# Patient Record
Sex: Female | Born: 1970 | Race: Black or African American | Hispanic: No | Smoking: Current some day smoker
Health system: Southern US, Community
[De-identification: ages and names within clinical notes are randomized; demographics above are authoritative.]

## PROBLEM LIST (undated history)

## (undated) DIAGNOSIS — F1910 Other psychoactive substance abuse, uncomplicated: Secondary | ICD-10-CM

## (undated) DIAGNOSIS — F1010 Alcohol abuse, uncomplicated: Secondary | ICD-10-CM

## (undated) DIAGNOSIS — F329 Major depressive disorder, single episode, unspecified: Secondary | ICD-10-CM

## (undated) DIAGNOSIS — E559 Vitamin D deficiency, unspecified: Secondary | ICD-10-CM

## (undated) DIAGNOSIS — K219 Gastro-esophageal reflux disease without esophagitis: Secondary | ICD-10-CM

## (undated) HISTORY — DX: Vitamin D deficiency, unspecified: E55.9

## (undated) HISTORY — PX: CHOLECYSTECTOMY: SHX55

## (undated) HISTORY — DX: Major depressive disorder, single episode, unspecified: F32.9

## (undated) HISTORY — DX: Other psychoactive substance abuse, uncomplicated: F19.10

## (undated) HISTORY — DX: Alcohol abuse, uncomplicated: F10.10

## (undated) HISTORY — PX: MANDIBLE RECONSTRUCTION: SHX431

## (undated) HISTORY — PX: MULTIPLE TOOTH EXTRACTIONS: SHX2053

---

## 1998-11-28 ENCOUNTER — Emergency Department (HOSPITAL_COMMUNITY): Admission: EM | Admit: 1998-11-28 | Discharge: 1998-11-29 | Payer: Self-pay | Admitting: Endocrinology

## 1998-11-29 ENCOUNTER — Encounter: Payer: Self-pay | Admitting: Emergency Medicine

## 1999-06-20 ENCOUNTER — Other Ambulatory Visit: Admission: RE | Admit: 1999-06-20 | Discharge: 1999-06-20 | Payer: Self-pay | Admitting: Obstetrics

## 1999-11-21 ENCOUNTER — Inpatient Hospital Stay (HOSPITAL_COMMUNITY): Admission: AD | Admit: 1999-11-21 | Discharge: 1999-11-21 | Payer: Self-pay | Admitting: Obstetrics

## 1999-11-28 ENCOUNTER — Inpatient Hospital Stay (HOSPITAL_COMMUNITY): Admission: AD | Admit: 1999-11-28 | Discharge: 1999-12-02 | Payer: Self-pay | Admitting: Obstetrics

## 1999-11-28 ENCOUNTER — Encounter (INDEPENDENT_AMBULATORY_CARE_PROVIDER_SITE_OTHER): Payer: Self-pay

## 2000-03-30 ENCOUNTER — Emergency Department (HOSPITAL_COMMUNITY): Admission: EM | Admit: 2000-03-30 | Discharge: 2000-03-30 | Payer: Self-pay | Admitting: Emergency Medicine

## 2005-06-04 ENCOUNTER — Emergency Department (HOSPITAL_COMMUNITY): Admission: EM | Admit: 2005-06-04 | Discharge: 2005-06-04 | Payer: Self-pay | Admitting: Emergency Medicine

## 2007-03-09 ENCOUNTER — Emergency Department (HOSPITAL_COMMUNITY): Admission: EM | Admit: 2007-03-09 | Discharge: 2007-03-09 | Payer: Self-pay | Admitting: Emergency Medicine

## 2007-03-31 ENCOUNTER — Emergency Department (HOSPITAL_COMMUNITY): Admission: EM | Admit: 2007-03-31 | Discharge: 2007-03-31 | Payer: Self-pay | Admitting: Emergency Medicine

## 2009-04-16 ENCOUNTER — Emergency Department (HOSPITAL_COMMUNITY): Admission: EM | Admit: 2009-04-16 | Discharge: 2009-04-16 | Payer: Self-pay | Admitting: Emergency Medicine

## 2009-07-03 ENCOUNTER — Emergency Department (HOSPITAL_COMMUNITY): Admission: EM | Admit: 2009-07-03 | Discharge: 2009-07-03 | Payer: Self-pay | Admitting: Emergency Medicine

## 2010-11-18 LAB — CBC
HCT: 36 % (ref 36.0–46.0)
Hemoglobin: 12.2 g/dL (ref 12.0–15.0)
MCHC: 34 g/dL (ref 30.0–36.0)
MCV: 91.8 fL (ref 78.0–100.0)
Platelets: 316 10*3/uL (ref 150–400)
RBC: 3.92 MIL/uL (ref 3.87–5.11)
RDW: 12.8 % (ref 11.5–15.5)
WBC: 8 10*3/uL (ref 4.0–10.5)

## 2010-11-18 LAB — DIFFERENTIAL
Basophils Absolute: 0.1 10*3/uL (ref 0.0–0.1)
Basophils Relative: 1 % (ref 0–1)
Eosinophils Absolute: 0.1 10*3/uL (ref 0.0–0.7)
Eosinophils Relative: 1 % (ref 0–5)
Lymphocytes Relative: 42 % (ref 12–46)
Lymphs Abs: 3.4 10*3/uL (ref 0.7–4.0)
Monocytes Absolute: 0.7 10*3/uL (ref 0.1–1.0)
Monocytes Relative: 8 % (ref 3–12)
Neutro Abs: 3.8 10*3/uL (ref 1.7–7.7)
Neutrophils Relative %: 48 % (ref 43–77)

## 2010-12-30 NOTE — Discharge Summary (Signed)
[facility]  Patient:    Kaylee White, Kaylee White                      MRN: [id number] Adm. Date:  [id number] Disc. Date: [id number] Attending:  Venita Sheffield                           Discharge Summary  HISTORY OF PRESENT ILLNESS:   The patient is a 40 year old, gravida 2, para 1-0-0-1, who had a previous cesarean section.  Her EDC was November 21, 1999, and she was brought in for induction.  Her cervix was closed, vertex -3.  HOSPITAL COURSE:              Cervidil was placed on admission and Pitocin began. The Pitocin was discontinued the first night on April 16.  The patient rested and was restarted on Pitocin at 4 a.m. on April 17.  The patient ruptured her membranes spontaneously at 1:30 p.m.  The fluid was clear.  Cervix was 1 to 2 cm, 80%, and vertex -2.  IUPC and scalp lead applied at that time and she was continued on her Pitocin.  She also had her epidural placed.  By 7:15 p.m. the patient started having prolonged deep variables and amnioinfusion had been begun earlier because of variable decellerations.  However, the problems was not resolved and she was delivered by repeat cesarean section.  She had a female, Apgars 5 and 9, weighing 6 pounds 11 ounces.  She also had a tubal ligation performed.  Postoperatively, she did well.  On admission her hemoglobin was 11.6 and on discharge 10.6.  She was  discharged on the third postoperative day, ambulatory, on a regular diet.  DISCHARGE MEDICATIONS:        Tylenol No.3 one q.4h. p.r.n.  FOLLOW-UP:                    She is to see me in six weeks.  DISCHARGE DIAGNOSIS:          Status post repeat low transverse cesarean section for failed induction and postpartum tubal ligation, multiparity. DD:  12/22/99 TD:  12/22/99 Job: [id number]

## 2017-05-28 ENCOUNTER — Emergency Department (HOSPITAL_COMMUNITY)
Admission: EM | Admit: 2017-05-28 | Discharge: 2017-05-28 | Disposition: A | Discharge status: Home / Self Care | Payer: Medicaid Other | Attending: Emergency Medicine | Admitting: Emergency Medicine

## 2017-05-28 ENCOUNTER — Encounter (HOSPITAL_COMMUNITY): Payer: Self-pay | Admitting: Emergency Medicine

## 2017-05-28 DIAGNOSIS — Z79899 Other long term (current) drug therapy: Secondary | ICD-10-CM | POA: Insufficient documentation

## 2017-05-28 DIAGNOSIS — Z87891 Personal history of nicotine dependence: Secondary | ICD-10-CM | POA: Insufficient documentation

## 2017-05-28 DIAGNOSIS — K0889 Other specified disorders of teeth and supporting structures: Secondary | ICD-10-CM | POA: Diagnosis present

## 2017-05-28 DIAGNOSIS — K047 Periapical abscess without sinus: Secondary | ICD-10-CM | POA: Insufficient documentation

## 2017-05-28 DIAGNOSIS — N390 Urinary tract infection, site not specified: Secondary | ICD-10-CM | POA: Insufficient documentation

## 2017-05-28 LAB — CBC WITH DIFFERENTIAL/PLATELET
Basophils Absolute: 0 10*3/uL (ref 0.0–0.1)
Basophils Relative: 0 %
Eosinophils Absolute: 0 10*3/uL (ref 0.0–0.7)
Eosinophils Relative: 0 %
HEMATOCRIT: 42.8 % (ref 36.0–46.0)
HEMOGLOBIN: 14.4 g/dL (ref 12.0–15.0)
LYMPHS ABS: 1.6 10*3/uL (ref 0.7–4.0)
Lymphocytes Relative: 13 %
MCH: 31.6 pg (ref 26.0–34.0)
MCHC: 33.6 g/dL (ref 30.0–36.0)
MCV: 94.1 fL (ref 78.0–100.0)
MONOS PCT: 7 %
Monocytes Absolute: 0.8 10*3/uL (ref 0.1–1.0)
Neutro Abs: 9.8 10*3/uL — ABNORMAL HIGH (ref 1.7–7.7)
Neutrophils Relative %: 80 %
Platelets: 385 10*3/uL (ref 150–400)
RBC: 4.55 MIL/uL (ref 3.87–5.11)
RDW: 13.2 % (ref 11.5–15.5)
WBC: 12.3 10*3/uL — AB (ref 4.0–10.5)

## 2017-05-28 LAB — URINALYSIS, ROUTINE W REFLEX MICROSCOPIC
Bilirubin Urine: NEGATIVE
Glucose, UA: NEGATIVE mg/dL
KETONES UR: NEGATIVE mg/dL
Nitrite: NEGATIVE
PROTEIN: 100 mg/dL — AB
Specific Gravity, Urine: 1.011 (ref 1.005–1.030)
pH: 5 (ref 5.0–8.0)

## 2017-05-28 LAB — COMPREHENSIVE METABOLIC PANEL
ALT: 29 U/L (ref 14–54)
AST: 19 U/L (ref 15–41)
Albumin: 4.8 g/dL (ref 3.5–5.0)
Alkaline Phosphatase: 107 U/L (ref 38–126)
Anion gap: 12 (ref 5–15)
BILIRUBIN TOTAL: 0.4 mg/dL (ref 0.3–1.2)
BUN: 10 mg/dL (ref 6–20)
CO2: 17 mmol/L — ABNORMAL LOW (ref 22–32)
Calcium: 10.1 mg/dL (ref 8.9–10.3)
Chloride: 109 mmol/L (ref 101–111)
Creatinine, Ser: 1.36 mg/dL — ABNORMAL HIGH (ref 0.44–1.00)
GFR calc Af Amer: 54 mL/min — ABNORMAL LOW (ref >60)
GFR, EST NON AFRICAN AMERICAN: 46 mL/min — AB (ref >60)
Glucose, Bld: 125 mg/dL — ABNORMAL HIGH (ref 65–99)
Potassium: 4 mmol/L (ref 3.5–5.1)
Sodium: 138 mmol/L (ref 135–145)
TOTAL PROTEIN: 9.6 g/dL — AB (ref 6.5–8.1)

## 2017-05-28 LAB — I-STAT CG4 LACTIC ACID, ED: Lactic Acid, Venous: 0.88 mmol/L (ref 0.5–1.9)

## 2017-05-28 MED ORDER — ONDANSETRON 4 MG PO TBDP
4.0000 mg | ORAL_TABLET | Freq: Once | ORAL | Status: AC | PRN
  Administered 2017-05-28: 4 mg via ORAL
  Filled 2017-05-28 (×2): qty 1

## 2017-05-28 MED ORDER — CEFTRIAXONE SODIUM 1 G IJ SOLR
1.0000 g | Freq: Once | INTRAMUSCULAR | Status: AC
  Administered 2017-05-28: 1 g via INTRAMUSCULAR
  Filled 2017-05-28: qty 10

## 2017-05-28 MED ORDER — PENICILLIN V POTASSIUM 500 MG PO TABS: 500.0000 mg | ORAL_TABLET | Freq: Four times a day (QID) | ORAL | 0 refills | Status: AC

## 2017-05-28 MED ORDER — HYDROMORPHONE HCL 1 MG/ML IJ SOLN
1.0000 mg | Freq: Once | INTRAMUSCULAR | Status: AC
  Administered 2017-05-28: 1 mg via INTRAMUSCULAR
  Filled 2017-05-28: qty 1

## 2017-05-28 MED ORDER — OXYCODONE-ACETAMINOPHEN 5-325 MG PO TABS: 1.0000 | ORAL_TABLET | Freq: Four times a day (QID) | ORAL | 0 refills | Status: DC | PRN

## 2017-05-28 MED ORDER — ONDANSETRON 4 MG PO TBDP: ORAL_TABLET | ORAL | 0 refills | Status: DC

## 2017-05-28 MED ORDER — LIDOCAINE HCL (PF) 1 % IJ SOLN
INTRAMUSCULAR | Status: AC
  Administered 2017-05-28: 2.1 mL
  Filled 2017-05-28: qty 5

## 2017-05-28 NOTE — ED Notes (Signed)
Bed: WA04 Expected date:  Expected time:  Means of arrival:  Comments: 

## 2017-05-28 NOTE — Discharge Instructions (Signed)
Follow-up with the dentist this week. If he can't see a dentist and see her family doctor this week

## 2017-05-28 NOTE — ED Notes (Signed)
Bed: WLPT1 Expected date:  Expected time:  Means of arrival:  Comments: 

## 2017-05-28 NOTE — ED Triage Notes (Signed)
Pt complaint of left lower dental pain with associated facial swelling; has appointment with dentist "but it is not for a while."

## 2017-05-28 NOTE — ED Notes (Signed)
No additional lactic drawn as the first one was WNL

## 2017-05-28 NOTE — ED Provider Notes (Signed)
[facility]-EMERGENCY DEPT Provider Note   CSN: [id number] Arrival date & time: 05/28/17  1332     History   Chief Complaint Chief Complaint  Patient presents with  . Dental Pain  . Facial Swelling    HPI Kaylee White is a 46 y.o. female.  Patient complains of pain  And swelling to the left side her face.   The history is provided by the patient.  Dental Pain   This is a new problem. The current episode started 1 to 2 hours ago. The problem occurs constantly. The problem has not changed since onset.The pain is at a severity of 6/10. The pain is moderate. She has tried nothing for the symptoms. The treatment provided no relief.    History reviewed. No pertinent past medical history.  There are no active problems to display for this patient.   History reviewed. No pertinent surgical history.  OB History    No data available       Home Medications    Prior to Admission medications   Medication Sig Start Date End Date Taking? Authorizing Provider  ibuprofen (ADVIL,MOTRIN) 200 MG tablet Take 600 mg by mouth every 4 (four) hours as needed for moderate pain.    Yes [provider]  oxyCODONE-acetaminophen (PERCOCET) 5-325 MG tablet Take 1 tablet by mouth every 6 (six) hours as needed. 05/28/17   Bethann Berkshire, MD  penicillin v potassium (VEETID) 500 MG tablet Take 1 tablet (500 mg total) by mouth 4 (four) times daily. 05/28/17 06/04/17  Bethann Berkshire, MD    Family History No family history on file.  Social History Social History  Substance Use Topics  . Smoking status: Former Smoker    Packs/day: 0.25    Types: Cigarettes  . Smokeless tobacco: Not on file  . Alcohol use No     Allergies   Patient has no known allergies.   Review of Systems Review of Systems  Constitutional: Negative for appetite change and fatigue.  HENT: Negative for congestion, ear discharge and sinus pressure.        Toothache  Eyes: Negative for  discharge.  Respiratory: Negative for cough.   Cardiovascular: Negative for chest pain.  Gastrointestinal: Negative for abdominal pain and diarrhea.  Genitourinary: Negative for frequency and hematuria.  Musculoskeletal: Negative for back pain.  Skin: Negative for rash.  Neurological: Negative for seizures and headaches.  Psychiatric/Behavioral: Negative for hallucinations.     Physical Exam Updated Vital Signs BP (!) 143/96   Pulse 71   Temp 97.7 F (36.5 C) (Oral)   Resp 18   SpO2 100%   Physical Exam  Constitutional: She is oriented to person, place, and time. She appears well-developed.  HENT:  Head: Normocephalic.  Swelling tenderness to left lower jaw  Eyes: Conjunctivae and EOM are normal. No scleral icterus.  Neck: Neck supple. No thyromegaly present.  Cardiovascular: Normal rate and regular rhythm.  Exam reveals no gallop and no friction rub.   No murmur heard. Pulmonary/Chest: No stridor. She has no wheezes. She has no rales. She exhibits no tenderness.  Abdominal: She exhibits no distension. There is no tenderness. There is no rebound.  Musculoskeletal: Normal range of motion. She exhibits no edema.  Lymphadenopathy:    She has no cervical adenopathy.  Neurological: She is oriented to person, place, and time. She exhibits normal muscle tone. Coordination normal.  Skin: No rash noted. No erythema.  Psychiatric: She has a normal mood and affect. Her  behavior is normal.     ED Treatments / Results  Labs (all labs ordered are listed, but only abnormal results are displayed) Labs Reviewed  COMPREHENSIVE METABOLIC PANEL - Abnormal; Notable for the following:       Result Value   CO2 17 (*)    Glucose, Bld 125 (*)    Creatinine, Ser 1.36 (*)    Total Protein 9.6 (*)    GFR calc non Af Amer 46 (*)    GFR calc Af Amer 54 (*)    All other components within normal limits  CBC WITH DIFFERENTIAL/PLATELET - Abnormal; Notable for the following:    WBC 12.3 (*)     Neutro Abs 9.8 (*)    All other components within normal limits  URINALYSIS, ROUTINE W REFLEX MICROSCOPIC - Abnormal; Notable for the following:    APPearance CLOUDY (*)    Hgb urine dipstick MODERATE (*)    Protein, ur 100 (*)    Leukocytes, UA LARGE (*)    Bacteria, UA FEW (*)    Squamous Epithelial / LPF 6-30 (*)    All other components within normal limits  URINE CULTURE  I-STAT CG4 LACTIC ACID, ED  I-STAT CG4 LACTIC ACID, ED    EKG  EKG Interpretation None       Radiology No results found.  Procedures Procedures (including critical care time)  Medications Ordered in ED Medications  ondansetron (ZOFRAN-ODT) disintegrating tablet 4 mg (not administered)  HYDROmorphone (DILAUDID) injection 1 mg (1 mg Intramuscular Given 05/28/17 2210)  cefTRIAXone (ROCEPHIN) injection 1 g (1 g Intramuscular Given 05/28/17 2214)  lidocaine (PF) (XYLOCAINE) 1 % injection (2.1 mLs  Given 05/28/17 2214)     Initial Impression / Assessment and Plan / ED Course  I have reviewed the triage vital signs and the nursing notes.  Pertinent labs & imaging results that were available during my care of the patient were reviewed by me and considered in my medical decision making (see chart for details).    patient with an abscessed tooth and a urinary tract infection. She will have urine cultured and will be started on penicillin for the tooth along with Percocets and she'll follow-up with the dentist. We will await the cultures of the urine see if she needs additional antibiotic besides penicillin to follow-up with her PCP   Final Clinical Impressions(s) / ED Diagnoses   Final diagnoses:  Dental abscess    New Prescriptions New Prescriptions   OXYCODONE-ACETAMINOPHEN (PERCOCET) 5-325 MG TABLET    Take 1 tablet by mouth every 6 (six) hours as needed.   PENICILLIN V POTASSIUM (VEETID) 500 MG TABLET    Take 1 tablet (500 mg total) by mouth 4 (four) times daily.     Bethann Berkshire,  MD 05/28/17 2244

## 2017-05-31 LAB — URINE CULTURE

## 2017-06-01 ENCOUNTER — Telehealth: Payer: Self-pay

## 2017-06-01 NOTE — Telephone Encounter (Signed)
No treatment for [facility] ED 05/28/17 Per Donivan Scullobyn Wojeck NP

## 2017-06-01 NOTE — Progress Notes (Signed)
ED Antimicrobial Stewardship Positive Culture Follow Up   Kaylee GreenhouseWyoshi L Fredric White is an 46 y.o. female [facility] with a chief complaint of  Chief Complaint  Patient presents with  . Dental Pain  . Facial Swelling    Recent Results (from the past 720 hour(s))  Urine culture     Status: Abnormal   Collection Time: 05/28/17  9:30 PM  Result Value Ref Range Status   Specimen Description URINE, CLEAN CATCH  Final   Special Requests NONE  Final   Culture >=100,000 COLONIES/mL ESCHERICHIA COLI (A)  Final   Report Status 05/31/2017 FINAL  Final   Organism ID, Bacteria ESCHERICHIA COLI (A)  Final      Susceptibility   Escherichia coli - MIC*    AMPICILLIN 4 SENSITIVE Sensitive     CEFAZOLIN <=4 SENSITIVE Sensitive     CEFTRIAXONE <=1 SENSITIVE Sensitive     CIPROFLOXACIN <=0.25 SENSITIVE Sensitive     GENTAMICIN <=1 SENSITIVE Sensitive     IMIPENEM <=0.25 SENSITIVE Sensitive     NITROFURANTOIN <=16 SENSITIVE Sensitive     TRIMETH/SULFA <=20 SENSITIVE Sensitive     AMPICILLIN/SULBACTAM <=2 SENSITIVE Sensitive     PIP/TAZO <=4 SENSITIVE Sensitive     Extended ESBL NEGATIVE Sensitive     * >=100,000 COLONIES/mL ESCHERICHIA COLI    Discharged with Pen V for dental abscess  Asmymptomatic bacteriuria. No need to treat  ED Provider: Donivan Scullobyn Wojeck, NP  Armandina StammerBATCHELDER,Almetta Liddicoat J 06/01/2017, 9:34 AM Infectious Diseases Pharmacist Phone# [phone]

## 2017-10-20 ENCOUNTER — Other Ambulatory Visit: Payer: Self-pay

## 2017-10-20 ENCOUNTER — Emergency Department (HOSPITAL_COMMUNITY): Payer: Medicaid Other

## 2017-10-20 ENCOUNTER — Emergency Department (HOSPITAL_COMMUNITY)
Admission: EM | Admit: 2017-10-20 | Discharge: 2017-10-20 | Disposition: A | Discharge status: Home / Self Care | Payer: Medicaid Other | Attending: Emergency Medicine | Admitting: Emergency Medicine

## 2017-10-20 DIAGNOSIS — Z87891 Personal history of nicotine dependence: Secondary | ICD-10-CM | POA: Insufficient documentation

## 2017-10-20 DIAGNOSIS — R141 Gas pain: Secondary | ICD-10-CM | POA: Insufficient documentation

## 2017-10-20 DIAGNOSIS — R079 Chest pain, unspecified: Secondary | ICD-10-CM | POA: Diagnosis present

## 2017-10-20 DIAGNOSIS — R1013 Epigastric pain: Secondary | ICD-10-CM | POA: Insufficient documentation

## 2017-10-20 LAB — ETHANOL: Alcohol, Ethyl (B): 15 mg/dL — ABNORMAL HIGH (ref <10)

## 2017-10-20 LAB — CBC WITH DIFFERENTIAL/PLATELET
Basophils Absolute: 0 10*3/uL (ref 0.0–0.1)
Basophils Relative: 1 %
EOS ABS: 0.1 10*3/uL (ref 0.0–0.7)
Eosinophils Relative: 1 %
HCT: 36.5 % (ref 36.0–46.0)
HEMOGLOBIN: 12.1 g/dL (ref 12.0–15.0)
LYMPHS ABS: 1.7 10*3/uL (ref 0.7–4.0)
Lymphocytes Relative: 22 %
MCH: 30.7 pg (ref 26.0–34.0)
MCHC: 33.2 g/dL (ref 30.0–36.0)
MCV: 92.6 fL (ref 78.0–100.0)
MONOS PCT: 9 %
Monocytes Absolute: 0.7 10*3/uL (ref 0.1–1.0)
NEUTROS ABS: 5.2 10*3/uL (ref 1.7–7.7)
NEUTROS PCT: 67 %
Platelets: 329 10*3/uL (ref 150–400)
RBC: 3.94 MIL/uL (ref 3.87–5.11)
RDW: 13.3 % (ref 11.5–15.5)
WBC: 7.7 10*3/uL (ref 4.0–10.5)

## 2017-10-20 LAB — COMPREHENSIVE METABOLIC PANEL
ALBUMIN: 4.2 g/dL (ref 3.5–5.0)
ALK PHOS: 124 U/L (ref 38–126)
ALT: 39 U/L (ref 14–54)
AST: 23 U/L (ref 15–41)
Anion gap: 13 (ref 5–15)
BUN: 11 mg/dL (ref 6–20)
CALCIUM: 9.3 mg/dL (ref 8.9–10.3)
CO2: 22 mmol/L (ref 22–32)
CREATININE: 0.76 mg/dL (ref 0.44–1.00)
Chloride: 105 mmol/L (ref 101–111)
GFR calc non Af Amer: >60 mL/min (ref >60)
GLUCOSE: 90 mg/dL (ref 65–99)
Potassium: 3.9 mmol/L (ref 3.5–5.1)
SODIUM: 140 mmol/L (ref 135–145)
TOTAL PROTEIN: 8.5 g/dL — AB (ref 6.5–8.1)
Total Bilirubin: 0.5 mg/dL (ref 0.3–1.2)

## 2017-10-20 LAB — I-STAT TROPONIN, ED: Troponin i, poc: 0 ng/mL (ref 0.00–0.08)

## 2017-10-20 LAB — LIPASE, BLOOD: Lipase: 33 U/L (ref 11–51)

## 2017-10-20 MED ORDER — SIMETHICONE 80 MG PO CHEW
160.0000 mg | CHEWABLE_TABLET | Freq: Once | ORAL | Status: AC
  Administered 2017-10-20: 160 mg via ORAL
  Filled 2017-10-20: qty 2

## 2017-10-20 NOTE — ED Provider Notes (Signed)
[facility] EMERGENCY DEPARTMENT Provider Note   CSN: [id number] Arrival date & time: 10/20/17  0117     History   Chief Complaint Chief Complaint  Patient presents with  . Chest Pain    HPI Kaylee White is a 47 y.o. female.  The history is provided by the patient and medical records.     47 year old female with history of alcohol abuse, presenting to the ED with epigastric/chest pain.  Reports this is been ongoing for about 2 weeks.  States it feels like a "bubble" in her lower midsternal region.  States pain is sharp and when she moves around she feels like the bubble is "floating around" inside. Has had some burning sensations as well. States it feels better when sitting upright, worse with lying down.  She denies any chest "pain" or shortness of breath.  No palpitations, dizziness, or weakness.  No diaphoresis, nausea, or vomiting.  Patient has no known cardiac history.  She did recently complete detox program for alcohol abuse, however has since started drinking again "states only a "little bit".  States she did take some Zantac prior to arrival which helped some with the burning but still feels the bubble sensation.  No past medical history on file.  There are no active problems to display for this patient.   No past surgical history on file.  OB History    No data available       Home Medications    Prior to Admission medications   Medication Sig Start Date End Date Taking? Authorizing Provider  ibuprofen (ADVIL,MOTRIN) 200 MG tablet Take 600 mg by mouth every 4 (four) hours as needed for moderate pain.     [provider]  ondansetron (ZOFRAN ODT) 4 MG disintegrating tablet 4mg  ODT q4 hours prn nausea/vomit 05/28/17   Bethann Berkshire, MD  oxyCODONE-acetaminophen (PERCOCET) 5-325 MG tablet Take 1 tablet by mouth every 6 (six) hours as needed. 05/28/17   Bethann Berkshire, MD    Family History No family history on file.  Social  History Social History   Tobacco Use  . Smoking status: Former Smoker    Packs/day: 0.25    Types: Cigarettes  Substance Use Topics  . Alcohol use: No  . Drug use: No     Allergies   Aspirin and Tylenol [acetaminophen]   Review of Systems Review of Systems  Cardiovascular: Positive for chest pain.  Gastrointestinal: Positive for abdominal pain.  All other systems reviewed and are negative.    Physical Exam Updated Vital Signs BP (!) 169/108 (BP Location: Right Arm)   Pulse 73   Temp 98.8 F (37.1 C) (Oral)   Ht 5' (1.524 m)   Wt 64.4 kg (142 lb)   SpO2 97%   BMI 27.73 kg/m   Physical Exam  Constitutional: She is oriented to person, place, and time. She appears well-developed and well-nourished.  HENT:  Head: Normocephalic and atraumatic.  Mouth/Throat: Oropharynx is clear and moist.  Eyes: Conjunctivae and EOM are normal. Pupils are equal, round, and reactive to light.  Neck: Normal range of motion.  Cardiovascular: Normal rate, regular rhythm and normal heart sounds.  Pulmonary/Chest: Effort normal and breath sounds normal. She has no wheezes. She has no rhonchi. She has no rales.  Abdominal: Soft. Bowel sounds are normal. There is no tenderness. There is no rigidity and no guarding.  Musculoskeletal: Normal range of motion.  Neurological: She is alert and oriented to person, place, and time.  Skin: Skin is warm and dry.  Psychiatric: She has a normal mood and affect.  Nursing note and vitals reviewed.    ED Treatments / Results  Labs (all labs ordered are listed, but only abnormal results are displayed) Labs Reviewed  COMPREHENSIVE METABOLIC PANEL - Abnormal; Notable for the following components:      Result Value   Total Protein 8.5 (*)    All other components within normal limits  ETHANOL - Abnormal; Notable for the following components:   Alcohol, Ethyl (B) 15 (*)    All other components within normal limits  CBC WITH DIFFERENTIAL/PLATELET   LIPASE, BLOOD  I-STAT TROPONIN, ED    EKG  EKG Interpretation  Date/Time:  Saturday October 20 2017 01:22:13 EST Ventricular Rate:  71 PR Interval:    QRS Duration: 78 QT Interval:  411 QTC Calculation: 447 R Axis:   68 Text Interpretation:  Sinus rhythm Normal ECG Confirmed by Gilda CreasePollina, Christopher J [phone]([id number]) on 10/20/2017 1:24:33 AM       Radiology Dg Chest 2 View  Result Date: 10/20/2017 CLINICAL DATA:  Acute onset of epigastric abdominal pain. EXAM: CHEST - 2 VIEW COMPARISON:  Chest radiograph performed 07/03/2009 FINDINGS: The lungs are well-aerated and clear. There is no evidence of focal opacification, pleural effusion or pneumothorax. The heart is normal in size; the mediastinal contour is within normal limits. No acute osseous abnormalities are seen. Clips are noted within the right upper quadrant, reflecting prior cholecystectomy. IMPRESSION: No acute cardiopulmonary process seen. Electronically Signed   By: Roanna RaiderJeffery  Chang M.D.   On: 10/20/2017 02:13    Procedures Procedures (including critical care time)  Medications Ordered in ED Medications - No data to display   Initial Impression / Assessment and Plan / ED Course  I have reviewed the triage vital signs and the nursing notes.  Pertinent labs & imaging results that were available during my care of the patient were reviewed by me and considered in my medical decision making (see chart for details).  47 year old female here with chest/epigastric pain over the past 2 weeks.  States it feels like a "gas bubble" in her lower chest.  Does report some burning sensation but that has improved after Zantac at home.  She is afebrile and nontoxic.  Abdomen is soft and benign.  Lungs are clear.  EKG nonischemic.  Screening labs overall reassuring.  Ethanol is 15, admits she has been drinking some alcohol recently.  Symptoms have resolved after Gas-X and soda here in the ED.  Abdomen remains soft and benign.  Suspect gas pains.  Lower  suspicion for ACS, PE, dissection, acute cardiac event.  Recommended she could continue gas-x at home.  Close follow-up with PCP.  Discussed plan with patient, she acknowledged understanding and agreed with plan of care.  Return precautions given for new or worsening symptoms.  Final Clinical Impressions(s) / ED Diagnoses   Final diagnoses:  Gas pain    ED Discharge Orders    None       Garlon HatchetSanders, Doris Mcgilvery M, PA-C 10/20/17 0519    Gilda CreasePollina, Christopher J, MD 10/20/17 [phone]

## 2017-10-20 NOTE — Discharge Instructions (Signed)
Can continue over the counter gas-x.  Keep carbonated beverages on hand, continue to get up and move around to help gas transit through. Follow-up with your primary care doctor. Return here for any new/worsening symptoms.

## 2017-10-20 NOTE — ED Notes (Signed)
Patient transported to X-ray 

## 2017-10-20 NOTE — ED Triage Notes (Signed)
Pt to ED via ems for epigastric pain x10 days. Hx of acid reflux, sts feels like bubble in chest. No hx of HTN, endorses stress. 180/112 with EMS. Denies N/V. NAD.

## 2017-11-09 ENCOUNTER — Encounter (HOSPITAL_COMMUNITY): Payer: Self-pay | Admitting: *Deleted

## 2017-11-09 ENCOUNTER — Emergency Department (HOSPITAL_COMMUNITY)
Admission: EM | Admit: 2017-11-09 | Discharge: 2017-11-09 | Disposition: A | Discharge status: Home / Self Care | Payer: Medicaid Other | Attending: Physician Assistant | Admitting: Physician Assistant

## 2017-11-09 ENCOUNTER — Other Ambulatory Visit: Payer: Self-pay

## 2017-11-09 DIAGNOSIS — Z79899 Other long term (current) drug therapy: Secondary | ICD-10-CM | POA: Insufficient documentation

## 2017-11-09 DIAGNOSIS — N3090 Cystitis, unspecified without hematuria: Secondary | ICD-10-CM | POA: Diagnosis not present

## 2017-11-09 DIAGNOSIS — R1030 Lower abdominal pain, unspecified: Secondary | ICD-10-CM | POA: Diagnosis present

## 2017-11-09 DIAGNOSIS — N3000 Acute cystitis without hematuria: Secondary | ICD-10-CM

## 2017-11-09 DIAGNOSIS — Z87891 Personal history of nicotine dependence: Secondary | ICD-10-CM | POA: Insufficient documentation

## 2017-11-09 LAB — URINALYSIS, ROUTINE W REFLEX MICROSCOPIC
Bilirubin Urine: NEGATIVE
GLUCOSE, UA: NEGATIVE mg/dL
Ketones, ur: NEGATIVE mg/dL
Nitrite: POSITIVE — AB
PH: 5 (ref 5.0–8.0)
Protein, ur: NEGATIVE mg/dL
Specific Gravity, Urine: 1.012 (ref 1.005–1.030)

## 2017-11-09 MED ORDER — CEPHALEXIN 500 MG PO CAPS: 500.0000 mg | ORAL_CAPSULE | Freq: Four times a day (QID) | ORAL | 0 refills | Status: DC

## 2017-11-09 MED ORDER — CEPHALEXIN 250 MG PO CAPS
500.0000 mg | ORAL_CAPSULE | Freq: Once | ORAL | Status: AC
  Administered 2017-11-09: 500 mg via ORAL
  Filled 2017-11-09: qty 2

## 2017-11-09 NOTE — ED Notes (Signed)
Pt verbalized understanding of d.c instructions, no further questions, pt a.o, ambulatory upon d.c 

## 2017-11-09 NOTE — Discharge Instructions (Addendum)
Please return with any concerns or if not improving.

## 2017-11-09 NOTE — ED Provider Notes (Signed)
[facility] Provider Note   CSN: [id number] Arrival date & time: 11/09/17  0901     History   Chief Complaint Chief Complaint  Patient presents with  . Urinary Tract Infection    HPI Demetrios IsaacsWyoshi L Wenk is a 47 y.o. female.  HPI   Patient is a [age] year old female presenting with suprapubic pain.  She states she feels like she might have a urinary tract infection.  Patient has no CVA pain or tenderness.  Patient has no nausea no vomiting.  No fevers.  Patient has no allergies to medications.  Patient has no vaginal discharge.   History reviewed. No pertinent past medical history.  There are no active problems to display for this patient.   History reviewed. No pertinent surgical history.   OB History   None      Home Medications    Prior to Admission medications   Medication Sig Start Date End Date Taking? Authorizing Provider  amantadine (SYMMETREL) 100 MG capsule Take 100 mg by mouth 3 (three) times daily. 09/25/17   [provider]  cephALEXin (KEFLEX) 500 MG capsule Take 1 capsule (500 mg total) by mouth 4 (four) times daily. 11/09/17   Rakeb Kibble Lyn, MD  FLUoxetine (PROZAC) 20 MG capsule Take 20 mg by mouth daily. 09/24/17   [provider]  ondansetron (ZOFRAN ODT) 4 MG disintegrating tablet 4mg  ODT q4 hours prn nausea/vomit Patient not taking: Reported on 10/20/2017 05/28/17   Bethann BerkshireZammit, Joseph, MD  oxyCODONE-acetaminophen (PERCOCET) 5-325 MG tablet Take 1 tablet by mouth every 6 (six) hours as needed. Patient not taking: Reported on 10/20/2017 05/28/17   Bethann BerkshireZammit, Joseph, MD  traZODone (DESYREL) 150 MG tablet Take 150 mg by mouth at bedtime. 09/24/17   [provider]    Family History History reviewed. No pertinent family history.  Social History Social History   Tobacco Use  . Smoking status: Former Smoker    Packs/day: 0.25    Types: Cigarettes  Substance Use Topics  . Alcohol use: No  .  Drug use: No     Allergies   Aspirin and Tylenol [acetaminophen]   Review of Systems Review of Systems  Constitutional: Negative for activity change.  Respiratory: Negative for chest tightness.   Cardiovascular: Negative for chest pain.  Skin: Negative for rash.  All other systems reviewed and are negative.    Physical Exam Updated Vital Signs BP (!) 156/88 (BP Location: Right Arm)   Pulse 80   Temp 97.8 F (36.6 C) (Oral)   Resp 20   SpO2 98%   Physical Exam  Constitutional: She is oriented to person, place, and time. She appears well-developed and well-nourished.  HENT:  Head: Normocephalic and atraumatic.  Eyes: Right eye exhibits no discharge. Left eye exhibits no discharge.  Cardiovascular: Normal rate, regular rhythm and normal heart sounds.  No murmur heard. Pulmonary/Chest: Effort normal and breath sounds normal. She has no wheezes. She has no rales.  Abdominal: Soft. She exhibits no distension. There is tenderness.  Suprapubic.  Neurological: She is oriented to person, place, and time.  Skin: Skin is warm and dry. She is not diaphoretic. Pallor:    Psychiatric: She has a normal mood and affect.  Nursing note and vitals reviewed.    ED Treatments / Results  Labs (all labs ordered are listed, but only abnormal results are displayed) Labs Reviewed  URINALYSIS, ROUTINE W REFLEX MICROSCOPIC - Abnormal; Notable for the following components:  Result Value   Hgb urine dipstick SMALL (*)    Nitrite POSITIVE (*)    Leukocytes, UA SMALL (*)    Bacteria, UA MANY (*)    Squamous Epithelial / LPF 0-5 (*)    All other components within normal limits  URINE CULTURE    EKG None  Radiology No results found.  Procedures Procedures (including critical care time)  Medications Ordered in ED Medications  cephALEXin (KEFLEX) capsule 500 mg (has no administration in time range)     Initial Impression / Assessment and Plan / ED Course  I have reviewed  the triage vital signs and the nursing notes.  Pertinent labs & imaging results that were available during my care of the patient were reviewed by me and considered in my medical decision making (see chart for details).      Patient is a 47 year old female presenting with suprapubic pain.  She states she feels like she might have a urinary tract infection.  Patient has no CVA pain or tenderness.  Patient has no nausea no vomiting.  No fevers.  Patient has no allergies to medications.  Patient has no vaginal discharge.  2:10 PM Urine appears infected.  Will give Keflex and return precautions. Final Clinical Impressions(s) / ED Diagnoses   Final diagnoses:  Acute cystitis without hematuria    ED Discharge Orders        Ordered    cephALEXin (KEFLEX) 500 MG capsule  4 times daily     11/09/17 1406       Kennis Buell Lyn, MD 11/09/17 1410

## 2017-11-09 NOTE — ED Notes (Signed)
C/o pain with urination and odor to urine onset 2 days ago.,

## 2017-11-09 NOTE — ED Triage Notes (Signed)
Pt reports having possible UTI. Reports dark colored urine with foul odor. Denies vaginal symptoms or pain.

## 2017-11-11 LAB — URINE CULTURE

## 2017-11-12 ENCOUNTER — Telehealth: Payer: Self-pay | Admitting: Emergency Medicine

## 2017-11-12 ENCOUNTER — Other Ambulatory Visit: Payer: Self-pay

## 2017-11-12 ENCOUNTER — Encounter (HOSPITAL_COMMUNITY): Payer: Self-pay

## 2017-11-12 ENCOUNTER — Emergency Department (HOSPITAL_COMMUNITY)
Admission: EM | Admit: 2017-11-12 | Discharge: 2017-11-12 | Disposition: A | Discharge status: Home / Self Care | Payer: Medicaid Other | Attending: Emergency Medicine | Admitting: Emergency Medicine

## 2017-11-12 ENCOUNTER — Emergency Department (HOSPITAL_COMMUNITY): Payer: Medicaid Other

## 2017-11-12 DIAGNOSIS — Y999 Unspecified external cause status: Secondary | ICD-10-CM | POA: Insufficient documentation

## 2017-11-12 DIAGNOSIS — M542 Cervicalgia: Secondary | ICD-10-CM | POA: Insufficient documentation

## 2017-11-12 DIAGNOSIS — Y92410 Unspecified street and highway as the place of occurrence of the external cause: Secondary | ICD-10-CM | POA: Insufficient documentation

## 2017-11-12 DIAGNOSIS — Y9389 Activity, other specified: Secondary | ICD-10-CM | POA: Diagnosis not present

## 2017-11-12 DIAGNOSIS — Z87891 Personal history of nicotine dependence: Secondary | ICD-10-CM | POA: Insufficient documentation

## 2017-11-12 DIAGNOSIS — R0789 Other chest pain: Secondary | ICD-10-CM | POA: Diagnosis not present

## 2017-11-12 DIAGNOSIS — M549 Dorsalgia, unspecified: Secondary | ICD-10-CM | POA: Insufficient documentation

## 2017-11-12 DIAGNOSIS — M7918 Myalgia, other site: Secondary | ICD-10-CM

## 2017-11-12 MED ORDER — PREDNISONE 20 MG PO TABS
60.0000 mg | ORAL_TABLET | Freq: Once | ORAL | Status: AC
  Administered 2017-11-12: 60 mg via ORAL
  Filled 2017-11-12: qty 3

## 2017-11-12 MED ORDER — CYCLOBENZAPRINE HCL 10 MG PO TABS: 10.0000 mg | ORAL_TABLET | Freq: Two times a day (BID) | ORAL | 0 refills | Status: DC | PRN

## 2017-11-12 MED ORDER — PREDNISONE 50 MG PO TABS: ORAL_TABLET | ORAL | 0 refills | Status: DC

## 2017-11-12 NOTE — ED Notes (Signed)
Patient transported to CT 
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
## 2017-11-12 NOTE — Discharge Instructions (Addendum)
Please follow with your primary care doctor in the next 2 days for a check-up. They must obtain records for further management.  ° °Do not hesitate to return to the Emergency Department for any new, worsening or concerning symptoms.  ° °

## 2017-11-12 NOTE — ED Notes (Signed)
Pt verbalized understanding of discharge paperwork. Ambulatory on discharge.

## 2017-11-12 NOTE — Telephone Encounter (Signed)
Post ED Visit - Positive Culture Follow-up  Culture report reviewed by antimicrobial stewardship pharmacist:  [x]  Kaylee White, Pharm.D. []  Celedonio MiyamotoJeremy Frens, Pharm.D., BCPS AQ-ID []  Garvin FilaMike Maccia, Pharm.D., BCPS []  Georgina PillionElizabeth Martin, Pharm.D., BCPS []  [place] IslandMinh Pham, [address] Rainbow BoulevardPharm.D., BCPS, AAHIVP []  Estella HuskMichelle Turner, Pharm.D., BCPS, AAHIVP []  Lysle Pearlachel Rumbarger, PharmD, BCPS []  Blake DivineShannon Parkey, PharmD []  Pollyann SamplesAndy Johnston, PharmD, BCPS  Positive urine culture Treated with cephalexin, organism sensitive to the same and no further patient follow-up is required at this time.  Kaylee White, Kaylee White 11/12/2017, 11:18 AM

## 2017-11-16 ENCOUNTER — Emergency Department (HOSPITAL_COMMUNITY)
Admission: EM | Admit: 2017-11-16 | Discharge: 2017-11-16 | Disposition: A | Discharge status: Home / Self Care | Payer: Medicaid Other | Attending: Emergency Medicine | Admitting: Emergency Medicine

## 2017-11-16 ENCOUNTER — Other Ambulatory Visit: Payer: Self-pay

## 2017-11-16 ENCOUNTER — Emergency Department (HOSPITAL_COMMUNITY): Payer: Medicaid Other

## 2017-11-16 ENCOUNTER — Encounter (HOSPITAL_COMMUNITY): Payer: Self-pay | Admitting: *Deleted

## 2017-11-16 DIAGNOSIS — Z87891 Personal history of nicotine dependence: Secondary | ICD-10-CM | POA: Insufficient documentation

## 2017-11-16 DIAGNOSIS — R079 Chest pain, unspecified: Secondary | ICD-10-CM | POA: Diagnosis not present

## 2017-11-16 DIAGNOSIS — R1010 Upper abdominal pain, unspecified: Secondary | ICD-10-CM | POA: Insufficient documentation

## 2017-11-16 LAB — COMPREHENSIVE METABOLIC PANEL
ALT: 63 U/L — ABNORMAL HIGH (ref 14–54)
ANION GAP: 11 (ref 5–15)
AST: 54 U/L — AB (ref 15–41)
Albumin: 4 g/dL (ref 3.5–5.0)
Alkaline Phosphatase: 82 U/L (ref 38–126)
BILIRUBIN TOTAL: 0.4 mg/dL (ref 0.3–1.2)
BUN: 15 mg/dL (ref 6–20)
CO2: 20 mmol/L — ABNORMAL LOW (ref 22–32)
Calcium: 8.9 mg/dL (ref 8.9–10.3)
Chloride: 103 mmol/L (ref 101–111)
Creatinine, Ser: 0.79 mg/dL (ref 0.44–1.00)
GFR calc Af Amer: >60 mL/min (ref >60)
Glucose, Bld: 77 mg/dL (ref 65–99)
POTASSIUM: 4 mmol/L (ref 3.5–5.1)
Sodium: 134 mmol/L — ABNORMAL LOW (ref 135–145)
TOTAL PROTEIN: 7.4 g/dL (ref 6.5–8.1)

## 2017-11-16 LAB — CBC
HEMATOCRIT: 40.3 % (ref 36.0–46.0)
Hemoglobin: 13.1 g/dL (ref 12.0–15.0)
MCH: 29.9 pg (ref 26.0–34.0)
MCHC: 32.5 g/dL (ref 30.0–36.0)
MCV: 92 fL (ref 78.0–100.0)
Platelets: 342 10*3/uL (ref 150–400)
RBC: 4.38 MIL/uL (ref 3.87–5.11)
RDW: 12.7 % (ref 11.5–15.5)
WBC: 11.3 10*3/uL — AB (ref 4.0–10.5)

## 2017-11-16 LAB — LIPASE, BLOOD: Lipase: 35 U/L (ref 11–51)

## 2017-11-16 LAB — I-STAT BETA HCG BLOOD, ED (MC, WL, AP ONLY): I-stat hCG, quantitative: <5 m[IU]/mL (ref <5)

## 2017-11-16 MED ORDER — GI COCKTAIL ~~LOC~~
30.0000 mL | Freq: Once | ORAL | Status: AC
  Administered 2017-11-16: 30 mL via ORAL
  Filled 2017-11-16: qty 30

## 2017-11-16 NOTE — ED Triage Notes (Signed)
Pt reports abdominal pain that started last night. Pt reports it as constant epigastric pain. Denies any other symptoms

## 2017-11-16 NOTE — ED Provider Notes (Signed)
[facility] EMERGENCY DEPARTMENT Provider Note   CSN: [id number] Arrival date & time: 11/16/17  0847     History   Chief Complaint Chief Complaint  Patient presents with  . Abdominal Pain    HPI Kaylee White is a 47 y.o. female.  HPI  47 year old female presents with upper abdominal pain.  Started last night about 35 minutes after eating dinner.  She states that the pain feels like the food never digested and is sitting in her stomach.  Occasionally radiates up into her chest.  No shortness of breath or current chest pain.  The pain is diffuse across her upper abdomen.  She denies any urinary symptoms and has not had nausea, vomiting, diarrhea or constipation.  No fevers during this time.  No back pain.  Patient states she has had gas pain and this feels similar.  She states when she was 17 she had gallstones removed. Pain is an 8/10. Has not tried anything for the pain.  History reviewed. No pertinent past medical history.  There are no active problems to display for this patient.   History reviewed. No pertinent surgical history.   OB History   None      Home Medications    Prior to Admission medications   Medication Sig Start Date End Date Taking? Authorizing Provider  cephALEXin (KEFLEX) 500 MG capsule Take 1 capsule (500 mg total) by mouth 4 (four) times daily. 11/09/17   Mackuen, Courteney Lyn, MD  cyclobenzaprine (FLEXERIL) 10 MG tablet Take 1 tablet (10 mg total) by mouth 2 (two) times daily as needed for muscle spasms. 11/12/17   Pisciotta, Joni Reining, PA-C  ondansetron (ZOFRAN ODT) 4 MG disintegrating tablet 4mg  ODT q4 hours prn nausea/vomit Patient not taking: Reported on 10/20/2017 05/28/17   Bethann Berkshire, MD  oxyCODONE-acetaminophen (PERCOCET) 5-325 MG tablet Take 1 tablet by mouth every 6 (six) hours as needed. Patient not taking: Reported on 10/20/2017 05/28/17   Bethann Berkshire, MD  predniSONE (DELTASONE) 50 MG tablet Take 1 tablet daily with  breakfast 11/12/17   Pisciotta, Joni Reining, PA-C    Family History No family history on file.  Social History Social History   Tobacco Use  . Smoking status: Former Smoker    Packs/day: 0.25    Types: Cigarettes  . Smokeless tobacco: Never Used  Substance Use Topics  . Alcohol use: No  . Drug use: No     Allergies   Aspirin and Tylenol [acetaminophen]   Review of Systems Review of Systems  Respiratory: Negative for shortness of breath.   Cardiovascular: Positive for chest pain.  Gastrointestinal: Positive for abdominal pain. Negative for abdominal distention, diarrhea, nausea and vomiting.  Musculoskeletal: Negative for back pain.  All other systems reviewed and are negative.    Physical Exam Updated Vital Signs BP (!) 170/100 (BP Location: Right Arm)   Pulse 74   Temp 97.9 F (36.6 C) (Oral)   Resp 16   SpO2 100%   Physical Exam  Constitutional: She is oriented to person, place, and time. She appears well-developed and well-nourished.  Non-toxic appearance. She does not appear ill. No distress.  Resting comfortably in stretcher  HENT:  Head: Normocephalic and atraumatic.  Right Ear: External ear normal.  Left Ear: External ear normal.  Nose: Nose normal.  Eyes: Right eye exhibits no discharge. Left eye exhibits no discharge.  Cardiovascular: Normal rate, regular rhythm and normal heart sounds.  Pulmonary/Chest: Effort normal and breath sounds normal.  Abdominal: Soft.  There is tenderness.  Mild generalized tenderness but most tender in epigastrum  Neurological: She is alert and oriented to person, place, and time.  Skin: Skin is warm and dry.  Nursing note and vitals reviewed.    ED Treatments / Results  Labs (all labs ordered are listed, but only abnormal results are displayed) Labs Reviewed  COMPREHENSIVE METABOLIC PANEL - Abnormal; Notable for the following components:      Result Value   Sodium 134 (*)    CO2 20 (*)    AST 54 (*)    ALT 63 (*)     All other components within normal limits  CBC - Abnormal; Notable for the following components:   WBC 11.3 (*)    All other components within normal limits  LIPASE, BLOOD  URINALYSIS, ROUTINE W REFLEX MICROSCOPIC  I-STAT BETA HCG BLOOD, ED ([facility], [facility], [facility] ONLY)  I-STAT TROPONIN, ED    EKG EKG Interpretation  Date/Time:  Friday November 16 2017 09:18:51 EDT Ventricular Rate:  77 PR Interval:  128 QRS Duration: 78 QT Interval:  352 QTC Calculation: 398 R Axis:   61 Text Interpretation:  Normal sinus rhythm Normal ECG no significant change since Mar 2019 Confirmed by Pricilla LovelessGoldston, Diasia Henken [phone]([id number]) on 11/16/2017 1:26:34 PM   Radiology Dg Chest 2 View  Result Date: 11/16/2017 CLINICAL DATA:  Chest pain EXAM: CHEST - 2 VIEW COMPARISON:  November 12, 2017 FINDINGS: Lungs are clear. The heart size and pulmonary vascularity are normal. No adenopathy. No pneumothorax. No bone lesions. There are surgical clips in the upper right abdomen. IMPRESSION: No edema or consolidation. Electronically Signed   By: Bretta BangWilliam  Woodruff III M.D.   On: 11/16/2017 13:52    Procedures Procedures (including critical care time)  Medications Ordered in ED Medications  gi cocktail (Maalox,Lidocaine,Donnatal) (30 mLs Oral Given 11/16/17 1342)     Initial Impression / Assessment and Plan / ED Course  I have reviewed the triage vital signs and the nursing notes.  Pertinent labs & imaging results that were available during my care of the patient were reviewed by me and considered in my medical decision making (see chart for details).     Patient symptoms are most likely stomach related given the location of upper abdominal pain and radiation into her chest.  She reports partial relief with GI cocktail.  However given her elevated blood pressure and some chest pain a troponin was ordered.  This has to be redrawn given how long it was since her last labs were obtained this morning.  However now patient is stating she needs to go  because of an emergency at her house.  I discussed that without the troponin I cannot fully exclude heart attack as a presentation for her.  She seems understand this but also states she needs to leave.  I have discussed strict return precautions and need for follow-up with PCP.  Otherwise, her exam was relatively benign and I do not think CT scan imaging would be emergently indicated.  Final Clinical Impressions(s) / ED Diagnoses   Final diagnoses:  Upper abdominal pain    ED Discharge Orders    None       Pricilla LovelessGoldston, Cristin Penaflor, MD 11/16/17 1415

## 2017-11-16 NOTE — ED Notes (Signed)
Patient placed on stretcher from waiting room.

## 2017-11-16 NOTE — ED Notes (Signed)
Patient returned from radiology and using nurse phone to make a phone call.

## 2017-11-16 NOTE — ED Notes (Signed)
Transported to radiology 

## 2017-11-16 NOTE — ED Notes (Signed)
Patient got off phone and stated she has to leave immediately due to family emergency. Doctor notified and spoke with patient regarding coming back to the ED and follow up care.  Patient has to sign out AMA. Patient unable to sign due to having to leave right away. Stated will come back to the ED later.

## 2017-11-18 ENCOUNTER — Emergency Department (HOSPITAL_COMMUNITY)
Admission: EM | Admit: 2017-11-18 | Discharge: 2017-11-19 | Disposition: A | Discharge status: Home / Self Care | Payer: Medicaid Other | Attending: Emergency Medicine | Admitting: Emergency Medicine

## 2017-11-18 ENCOUNTER — Other Ambulatory Visit: Payer: Self-pay

## 2017-11-18 ENCOUNTER — Emergency Department (HOSPITAL_COMMUNITY): Payer: Medicaid Other

## 2017-11-18 ENCOUNTER — Encounter (HOSPITAL_COMMUNITY): Payer: Self-pay

## 2017-11-18 DIAGNOSIS — R1013 Epigastric pain: Secondary | ICD-10-CM | POA: Insufficient documentation

## 2017-11-18 DIAGNOSIS — R072 Precordial pain: Secondary | ICD-10-CM | POA: Diagnosis not present

## 2017-11-18 DIAGNOSIS — Z87891 Personal history of nicotine dependence: Secondary | ICD-10-CM | POA: Insufficient documentation

## 2017-11-18 DIAGNOSIS — R141 Gas pain: Secondary | ICD-10-CM | POA: Diagnosis not present

## 2017-11-18 DIAGNOSIS — Z79899 Other long term (current) drug therapy: Secondary | ICD-10-CM | POA: Insufficient documentation

## 2017-11-18 DIAGNOSIS — R079 Chest pain, unspecified: Secondary | ICD-10-CM | POA: Diagnosis present

## 2017-11-18 LAB — BASIC METABOLIC PANEL
ANION GAP: 10 (ref 5–15)
BUN: 17 mg/dL (ref 6–20)
CALCIUM: 8.8 mg/dL — AB (ref 8.9–10.3)
CO2: 22 mmol/L (ref 22–32)
Chloride: 104 mmol/L (ref 101–111)
Creatinine, Ser: 0.93 mg/dL (ref 0.44–1.00)
GFR calc non Af Amer: >60 mL/min (ref >60)
Glucose, Bld: 90 mg/dL (ref 65–99)
POTASSIUM: 3.5 mmol/L (ref 3.5–5.1)
SODIUM: 136 mmol/L (ref 135–145)

## 2017-11-18 LAB — I-STAT TROPONIN, ED: TROPONIN I, POC: 0 ng/mL (ref 0.00–0.08)

## 2017-11-18 LAB — CBC
HEMATOCRIT: 35.8 % — AB (ref 36.0–46.0)
HEMOGLOBIN: 12 g/dL (ref 12.0–15.0)
MCH: 30.2 pg (ref 26.0–34.0)
MCHC: 33.5 g/dL (ref 30.0–36.0)
MCV: 89.9 fL (ref 78.0–100.0)
Platelets: 370 10*3/uL (ref 150–400)
RBC: 3.98 MIL/uL (ref 3.87–5.11)
RDW: 12.7 % (ref 11.5–15.5)
WBC: 12.8 10*3/uL — AB (ref 4.0–10.5)

## 2017-11-18 LAB — I-STAT BETA HCG BLOOD, ED (MC, WL, AP ONLY)

## 2017-11-18 NOTE — ED Triage Notes (Signed)
Patient c/o CP x2 hours that won't go away; states that she was seen here two days ago for same.

## 2017-11-19 LAB — I-STAT TROPONIN, ED: Troponin i, poc: 0 ng/mL (ref 0.00–0.08)

## 2017-11-19 MED ORDER — GI COCKTAIL ~~LOC~~
30.0000 mL | Freq: Once | ORAL | Status: AC
  Administered 2017-11-19: 30 mL via ORAL
  Filled 2017-11-19: qty 30

## 2017-11-19 MED ORDER — OMEPRAZOLE 20 MG PO CPDR: 20.0000 mg | DELAYED_RELEASE_CAPSULE | Freq: Every day | ORAL | 0 refills | Status: DC

## 2017-11-19 NOTE — ED Notes (Signed)
PT states understanding of care given, follow up care, and medication prescribed. PT ambulated from ED to car with a steady gait. 
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
## 2019-02-09 ENCOUNTER — Encounter (HOSPITAL_COMMUNITY): Payer: Self-pay

## 2019-02-09 ENCOUNTER — Emergency Department (HOSPITAL_COMMUNITY)
Admission: EM | Admit: 2019-02-09 | Discharge: 2019-02-09 | Disposition: A | Discharge status: Home / Self Care | Payer: Medicaid Other | Attending: Emergency Medicine | Admitting: Emergency Medicine

## 2019-02-09 ENCOUNTER — Other Ambulatory Visit: Payer: Self-pay

## 2019-02-09 DIAGNOSIS — N390 Urinary tract infection, site not specified: Secondary | ICD-10-CM | POA: Insufficient documentation

## 2019-02-09 DIAGNOSIS — Z79899 Other long term (current) drug therapy: Secondary | ICD-10-CM | POA: Insufficient documentation

## 2019-02-09 DIAGNOSIS — F17210 Nicotine dependence, cigarettes, uncomplicated: Secondary | ICD-10-CM | POA: Insufficient documentation

## 2019-02-09 MED ORDER — NITROFURANTOIN MONOHYD MACRO 100 MG PO CAPS: 100.0000 mg | ORAL_CAPSULE | Freq: Two times a day (BID) | ORAL | 0 refills | Status: AC
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
## 2019-02-09 NOTE — ED Notes (Signed)
PT states understanding of care given, follow up care, and medication prescribed. PT ambulated from ED to car with a steady gait. 

## 2019-02-09 NOTE — ED Provider Notes (Signed)
[facility]  CSN: [id number] Arrival date & time: 02/09/19 0121  Chief Complaint(s) Abdominal Pain  HPI Kaylee White is a 48 y.o. female   The history is provided by the patient.  Abdominal Pain Pain location:  Suprapubic Pain quality: aching   Pain radiates to:  Does not radiate Pain severity:  Mild Onset quality:  Gradual Duration: 3-5 days. Timing:  Constant Progression:  Waxing and waning Chronicity:  New Context comment:  [facility] and [facility]bid, but was only able to pick up 4 out of 14 pills from pharmacy. Relieved by:  Nothing Worsened by:  Nothing Associated symptoms: no chest pain, no chills, no constipation, no cough, no fever, no hematemesis, no hematochezia, no nausea, no vaginal bleeding, no vaginal discharge and no vomiting    [facility] she also had negative UPT, GC/Chlam, and wet prep.  She moved back to [place] and needs new Rx for the rest of her Macrobid.   Past Medical History History reviewed. No pertinent past medical history. There are no active problems to display for this patient.  Home Medication(s) Prior to Admission medications   Medication Sig Start Date End Date Taking? Authorizing Provider  cephALEXin (KEFLEX) 500 MG capsule Take 1 capsule (500 mg total) by mouth 4 (four) times daily. Patient not taking: Reported on 11/19/2017 11/09/17   Mackuen, Courteney Lyn, MD  cyclobenzaprine (FLEXERIL) 10 MG tablet Take 1 tablet (10 mg total) by mouth 2 (two) times daily as needed for muscle spasms. Patient not taking: Reported on 11/19/2017 11/12/17   Pisciotta, Joni ReiningNicole, PA-C  nitrofurantoin, macrocrystal-monohydrate, (MACROBID) 100 MG capsule Take 1 capsule (100 mg total) by mouth 2 (two) times daily for 5 days. 02/09/19 02/14/19  Nira Connardama,  Eduardo, MD  omeprazole (PRILOSEC) 20 MG capsule Take 1 capsule (20 mg total) by mouth daily. 11/19/17   Palumbo, April, MD  ondansetron (ZOFRAN ODT)  4 MG disintegrating tablet 4mg  ODT q4 hours prn nausea/vomit Patient not taking: Reported on 10/20/2017 05/28/17   Bethann BerkshireZammit, Joseph, MD  oxyCODONE-acetaminophen (PERCOCET) 5-325 MG tablet Take 1 tablet by mouth every 6 (six) hours as needed. Patient not taking: Reported on 10/20/2017 05/28/17   Bethann BerkshireZammit, Joseph, MD  predniSONE (DELTASONE) 50 MG tablet Take 1 tablet daily with breakfast Patient not taking: Reported on 11/19/2017 11/12/17   Pisciotta, Mardella LaymanNicole, PA-C                                                                                                                                    Past Surgical History Past Surgical History:  Procedure Laterality Date  . CHOLECYSTECTOMY     Family History History reviewed. No pertinent family history.  Social History Social History   Tobacco Use  . Smoking status: Current Some Day Smoker    Packs/day: 0.25    Types: Cigarettes  . Smokeless tobacco: Never Used  Substance  Use Topics  . Alcohol use: No  . Drug use: No   Allergies Aspirin and Tylenol [acetaminophen]  Review of Systems Review of Systems  Constitutional: Negative for chills and fever.  Respiratory: Negative for cough.   Cardiovascular: Negative for chest pain.  Gastrointestinal: Positive for abdominal pain. Negative for constipation, hematemesis, hematochezia, nausea and vomiting.  Genitourinary: Negative for vaginal bleeding and vaginal discharge.   All other systems are reviewed and are negative for acute change except as noted in the HPI  Physical Exam Vital Signs  I have reviewed the triage vital signs BP 115/65 (BP Location: Right Arm)   Pulse 67   Temp 98.4 F (36.9 C) (Oral)   Resp 18   Ht 5' (1.524 m)   Wt 87.5 kg   SpO2 97%   BMI 37.69 kg/m   Physical Exam Vitals signs reviewed.  Constitutional:      General: She is not in acute distress.    Appearance: She is well-developed. She is not diaphoretic.  HENT:     Head: Normocephalic and atraumatic.     Right  Ear: External ear normal.     Left Ear: External ear normal.     Nose: Nose normal.  Eyes:     General: No scleral icterus.    Conjunctiva/sclera: Conjunctivae normal.  Neck:     Musculoskeletal: Normal range of motion.     Trachea: Phonation normal.  Cardiovascular:     Rate and Rhythm: Normal rate and regular rhythm.  Pulmonary:     Effort: Pulmonary effort is normal. No respiratory distress.     Breath sounds: No stridor.  Abdominal:     General: There is no distension.     Tenderness: There is abdominal tenderness (discomfort) in the suprapubic area. There is no right CVA tenderness, left CVA tenderness, guarding or rebound. Negative signs include McBurney's sign.  Musculoskeletal: Normal range of motion.  Neurological:     Mental Status: She is alert and oriented to person, place, and time.  Psychiatric:        Behavior: Behavior normal.     ED Results and Treatments Labs (all labs ordered are listed, but only abnormal results are displayed) Labs Reviewed - No data to display                                                                                                                       EKG  EKG Interpretation  Date/Time:    Ventricular Rate:    PR Interval:    QRS Duration:   QT Interval:    QTC Calculation:   R Axis:     Text Interpretation:        Radiology No results found.  Pertinent labs & imaging results that were available during my care of the patient were reviewed by me and considered in my medical decision making (see chart for details).  Medications Ordered in ED Medications - No data to display  Procedures Procedures  (including critical care time)  Medical Decision Making / ED Course I have reviewed the nursing notes for this encounter and the patient's prior records (if available in EHR or on provided  paperwork).  [facility] and [facility]  Symptoms are consistent with her UTI. No need for additional work up at this time. Will Rx rest of Macrobid.  The patient appears reasonably screened and/or stabilized for discharge and I doubt any other medical condition or other [facility] requiring further screening, evaluation, or treatment in the ED at this time prior to discharge.  The patient is safe for discharge with strict return precautions.  Kaylee White was evaluated in Emergency Department on 02/09/2019 for the symptoms described in the history of present illness. She was evaluated in the context of the global COVID-19 pandemic, which necessitated consideration that the patient might be at risk for infection with the SARS-CoV-2 virus that causes COVID-19. Institutional protocols and algorithms that pertain to the evaluation of patients at risk for COVID-19 are in a state of rapid change based on information released by regulatory bodies including the CDC and federal and state organizations. These policies and algorithms were followed during the patient's care in the ED.       Final Clinical Impression(s) / ED Diagnoses Final diagnoses:  Lower urinary tract infectious disease    The patient appears reasonably screened and/or stabilized for discharge and I doubt any other medical condition or other [facility] requiring further screening, evaluation, or treatment in the ED at this time prior to discharge.  Disposition: Discharge  Condition: Good  I have discussed the results, Dx and Tx plan with the patient who expressed understanding and agree(s) with the plan. Discharge instructions discussed at great length. The patient was given strict return precautions who verbalized understanding of the instructions. No further questions at time of discharge.    ED Discharge Orders         Ordered    nitrofurantoin, macrocrystal-monohydrate, (MACROBID) 100 MG capsule  2  times daily     02/09/19 0202          Follow Up: [place], [facility] [address] [place] [address] [phone]         This chart was dictated using voice recognition software.  Despite best efforts to proofread,  errors can occur which can change the documentation meaning.   Nira Connardama,  Eduardo, MD 02/09/19 Earle [id number]

## 2019-04-15 ENCOUNTER — Encounter (HOSPITAL_COMMUNITY): Payer: Self-pay

## 2019-04-15 ENCOUNTER — Emergency Department (HOSPITAL_COMMUNITY): Payer: Self-pay

## 2019-04-15 ENCOUNTER — Emergency Department (HOSPITAL_COMMUNITY)
Admission: EM | Admit: 2019-04-15 | Discharge: 2019-04-15 | Disposition: A | Discharge status: Home / Self Care | Payer: Self-pay | Attending: Emergency Medicine | Admitting: Emergency Medicine

## 2019-04-15 ENCOUNTER — Other Ambulatory Visit: Payer: Self-pay

## 2019-04-15 DIAGNOSIS — R531 Weakness: Secondary | ICD-10-CM | POA: Insufficient documentation

## 2019-04-15 DIAGNOSIS — R079 Chest pain, unspecified: Secondary | ICD-10-CM | POA: Insufficient documentation

## 2019-04-15 DIAGNOSIS — R6883 Chills (without fever): Secondary | ICD-10-CM | POA: Insufficient documentation

## 2019-04-15 DIAGNOSIS — R110 Nausea: Secondary | ICD-10-CM | POA: Insufficient documentation

## 2019-04-15 DIAGNOSIS — Z20828 Contact with and (suspected) exposure to other viral communicable diseases: Secondary | ICD-10-CM | POA: Insufficient documentation

## 2019-04-15 DIAGNOSIS — Z79899 Other long term (current) drug therapy: Secondary | ICD-10-CM | POA: Insufficient documentation

## 2019-04-15 DIAGNOSIS — F17210 Nicotine dependence, cigarettes, uncomplicated: Secondary | ICD-10-CM | POA: Insufficient documentation

## 2019-04-15 LAB — BASIC METABOLIC PANEL
Anion gap: 11 (ref 5–15)
BUN: 7 mg/dL (ref 6–20)
CO2: 21 mmol/L — ABNORMAL LOW (ref 22–32)
Calcium: 8.8 mg/dL — ABNORMAL LOW (ref 8.9–10.3)
Chloride: 104 mmol/L (ref 98–111)
Creatinine, Ser: 1.05 mg/dL — ABNORMAL HIGH (ref 0.44–1.00)
GFR calc Af Amer: >60 mL/min (ref >60)
GFR calc non Af Amer: >60 mL/min (ref >60)
Glucose, Bld: 105 mg/dL — ABNORMAL HIGH (ref 70–99)
Potassium: 3.5 mmol/L (ref 3.5–5.1)
Sodium: 136 mmol/L (ref 135–145)

## 2019-04-15 LAB — URINALYSIS, ROUTINE W REFLEX MICROSCOPIC
Bilirubin Urine: NEGATIVE
Glucose, UA: NEGATIVE mg/dL
Ketones, ur: NEGATIVE mg/dL
Nitrite: NEGATIVE
Protein, ur: NEGATIVE mg/dL
Specific Gravity, Urine: 1.017 (ref 1.005–1.030)
pH: 5 (ref 5.0–8.0)

## 2019-04-15 LAB — CBC
HCT: 39.1 % (ref 36.0–46.0)
Hemoglobin: 12.2 g/dL (ref 12.0–15.0)
MCH: 28.2 pg (ref 26.0–34.0)
MCHC: 31.2 g/dL (ref 30.0–36.0)
MCV: 90.5 fL (ref 80.0–100.0)
Platelets: 433 10*3/uL — ABNORMAL HIGH (ref 150–400)
RBC: 4.32 MIL/uL (ref 3.87–5.11)
RDW: 13.2 % (ref 11.5–15.5)
WBC: 12.2 10*3/uL — ABNORMAL HIGH (ref 4.0–10.5)
nRBC: 0 % (ref 0.0–0.2)

## 2019-04-15 LAB — SARS CORONAVIRUS 2 (TAT 6-24 HRS): SARS Coronavirus 2: NEGATIVE

## 2019-04-15 LAB — TROPONIN I (HIGH SENSITIVITY): Troponin I (High Sensitivity): 3 ng/L (ref <18)

## 2019-04-15 LAB — CBG MONITORING, ED: Glucose-Capillary: 100 mg/dL — ABNORMAL HIGH (ref 70–99)

## 2019-04-15 MED ORDER — SODIUM CHLORIDE 0.9% FLUSH: 3.0000 mL | Freq: Once | INTRAVENOUS | Status: DC

## 2019-04-15 NOTE — ED Notes (Signed)
Patient verbalizes understanding of discharge instructions . Opportunity for questions and answers were provided . Armband removed by staff ,Pt discharged from ED. W/C  offered at D/C  and Declined W/C at D/C and was escorted to lobby by RN.  

## 2019-04-15 NOTE — Discharge Instructions (Signed)
It was my pleasure taking care of you today!  Call your primary care doctor tomorrow to schedule a follow up appointment.   Return to ER for new or worsening symptoms, any additional concerns.

## 2019-04-15 NOTE — ED Notes (Signed)
Patient transported to X-ray 

## 2019-04-15 NOTE — ED Provider Notes (Signed)
[facility] Provider Note   CSN: [id number] Arrival date & time: 04/15/19  1419     History   Chief Complaint Chief Complaint  Patient presents with  . Weakness    HPI Kaylee White is a 48 y.o. female.     The history is provided by the patient and medical records. No language interpreter was used.  Weakness Associated symptoms: chest pain and nausea   Associated symptoms: no abdominal pain, no cough, no diarrhea, no dysuria, no fever, no shortness of breath and no vomiting    Kaylee White is a 48 y.o. female  who presents to the Emergency [facility] complaining of generalized weakness which began around 9 PM last night.  Associated with chills.  Denies fever.  She states that overall she just did not feel well and was very tired.  She started to eat and shortly after developed a burning sensation to her mid chest.  No radiation of her pain.  Felt as if her food was not digesting and something was just sitting in her chest.  This did resolve.  She has not had any abdominal pain or chest pain today.  She was a little nauseous when she woke up this morning, but this too has resolved.  Overall, she feels much better than she did last night.  Denies any cough, congestion, sore throat or shortness of breath.  No urinary symptoms.  No sick contacts.  History reviewed. No pertinent past medical history.  There are no active problems to display for this patient.   Past Surgical History:  Procedure Laterality Date  . CHOLECYSTECTOMY       OB History   No obstetric history on file.      Home Medications    Prior to Admission medications   Medication Sig Start Date End Date Taking? Authorizing Provider  cephALEXin (KEFLEX) 500 MG capsule Take 1 capsule (500 mg total) by mouth 4 (four) times daily. Patient not taking: Reported on 11/19/2017 11/09/17   Mackuen, Courteney Lyn, MD  cyclobenzaprine (FLEXERIL) 10 MG tablet Take 1 tablet (10 mg  total) by mouth 2 (two) times daily as needed for muscle spasms. Patient not taking: Reported on 11/19/2017 11/12/17   Pisciotta, Elmyra Ricks, PA-C  omeprazole (PRILOSEC) 20 MG capsule Take 1 capsule (20 mg total) by mouth daily. 11/19/17   Palumbo, April, MD  ondansetron (ZOFRAN ODT) 4 MG disintegrating tablet 4mg  ODT q4 hours prn nausea/vomit Patient not taking: Reported on 10/20/2017 05/28/17   Milton Ferguson, MD  oxyCODONE-acetaminophen (PERCOCET) 5-325 MG tablet Take 1 tablet by mouth every 6 (six) hours as needed. Patient not taking: Reported on 10/20/2017 05/28/17   Milton Ferguson, MD  predniSONE (DELTASONE) 50 MG tablet Take 1 tablet daily with breakfast Patient not taking: Reported on 11/19/2017 11/12/17   Pisciotta, Charna Elizabeth    Family History History reviewed. No pertinent family history.  Social History Social History   Tobacco Use  . Smoking status: Current Some Day Smoker    Packs/day: 0.25    Types: Cigarettes  . Smokeless tobacco: Never Used  Substance Use Topics  . Alcohol use: No  . Drug use: No     Allergies   Aspirin and Tylenol [acetaminophen]   Review of Systems Review of Systems  Constitutional: Positive for chills. Negative for fever.  HENT: Negative for congestion and sore throat.   Respiratory: Negative for cough and shortness of breath.   Cardiovascular: Positive for chest pain. Negative  for palpitations and leg swelling.  Gastrointestinal: Positive for nausea. Negative for abdominal pain, constipation, diarrhea and vomiting.  Genitourinary: Negative for dysuria.  Neurological: Positive for weakness.  All other systems reviewed and are negative.    Physical Exam Updated Vital Signs BP 114/73   Pulse 87   Temp 98.7 F (37.1 C) (Oral)   Resp 18   SpO2 99%   Physical Exam Vitals signs and nursing note reviewed.  Constitutional:      General: She is not in acute distress.    Appearance: She is well-developed.     Comments: Nontoxic-appearing.  HENT:      Head: Normocephalic and atraumatic.  Neck:     Musculoskeletal: Neck supple.  Cardiovascular:     Rate and Rhythm: Normal rate and regular rhythm.     Heart sounds: Normal heart sounds. No murmur.  Pulmonary:     Effort: Pulmonary effort is normal. No respiratory distress.     Breath sounds: Normal breath sounds.     Comments: Lungs clear to auscultation bilaterally. No chest tenderness. Abdominal:     General: There is no distension.     Palpations: Abdomen is soft.     Comments: No abdominal tenderness.  Skin:    General: Skin is warm and dry.  Neurological:     Mental Status: She is alert and oriented to person, place, and time.      ED Treatments / Results  Labs (all labs ordered are listed, but only abnormal results are displayed) Labs Reviewed  BASIC METABOLIC PANEL - Abnormal; Notable for the following components:      Result Value   CO2 21 (*)    Glucose, Bld 105 (*)    Creatinine, Ser 1.05 (*)    Calcium 8.8 (*)    All other components within normal limits  CBC - Abnormal; Notable for the following components:   WBC 12.2 (*)    Platelets 433 (*)    All other components within normal limits  URINALYSIS, ROUTINE W REFLEX MICROSCOPIC - Abnormal; Notable for the following components:   APPearance HAZY (*)    Hgb urine dipstick MODERATE (*)    Leukocytes,Ua TRACE (*)    Bacteria, UA RARE (*)    All other components within normal limits  CBG MONITORING, ED - Abnormal; Notable for the following components:   Glucose-Capillary 100 (*)    All other components within normal limits  SARS CORONAVIRUS 2 (TAT 6-24 HRS)  TROPONIN I (HIGH SENSITIVITY)  TROPONIN I (HIGH SENSITIVITY)    EKG EKG Interpretation  Date/Time:  Tuesday April 15 2019 14:35:19 EDT Ventricular Rate:  91 PR Interval:  136 QRS Duration: 70 QT Interval:  350 QTC Calculation: 430 R Axis:   66 Text Interpretation:  Normal sinus rhythm no acute ST/T changes similar to April 2019  Confirmed by Pricilla LovelessGoldston, Scott [id number]) on 04/15/2019 7:02:49 PM   Radiology Dg Chest 2 View  Result Date: 04/15/2019 CLINICAL DATA:  Chest pain, smoker EXAM: CHEST - 2 VIEW COMPARISON:  11/18/2017 FINDINGS: Normal heart size, mediastinal contours, and pulmonary vascularity. Lungs clear. No pulmonary infiltrate, pleural effusion or pneumothorax. Bones unremarkable. Surgical clips RIGHT upper quadrant likely from prior cholecystectomy. IMPRESSION: No acute abnormalities. Electronically Signed   By: Ulyses SouthwardMark  Boles M.D.   On: 04/15/2019 17:45    Procedures Procedures (including critical care time)  Medications Ordered in ED Medications  sodium chloride flush (NS) 0.9 % injection 3 mL (has no administration in time range)  Initial Impression / Assessment and Plan / ED Course  I have reviewed the triage vital signs and the nursing notes.  Pertinent labs & imaging results that were available during my care of the patient were reviewed by me and considered in my medical decision making (see chart for details).       Kaylee [facility] is a 48 y.o. female who presents to ED for generalized weakness and overall feeling unwell which began about 9 PM last night.  She tried to eat something and after eating, had burning chest pain.  This did improve after about an hour.  When she woke this morning, she was not having any chest pain.  She denies any abdominal pain, nausea or vomiting today.  She states that she just feels very tired and weak without other symptoms.  She feels better today than yesterday.  Labs are overall very reassuring.  Does have a nonspecific leukocytosis of 12.2.  Chest x-ray and urine without signs of infection.  EKG without acute ischemic changes and troponin 3. Doubt ACS, PE, Dissection. Chest pain sounds more GI related, possibly reflux. She is s/p cholecystectomy and has no abdominal tenderness on exam.  Doubt imaging would be of much benefit currently.  COVID test sent. Evaluation  does not show pathology that would require ongoing emergent intervention or inpatient treatment.  PCP follow-up encouraged.  Reasons to return to the emergency [facility] were discussed and all questions were answered.   Final Clinical Impressions(s) / ED Diagnoses   Final diagnoses:  Chest pain  Weakness    ED Discharge Orders    None       Latarra Eagleton, Chase Picket, PA-C 04/15/19 1924    Pricilla Loveless, MD 04/15/19 1950

## 2019-04-15 NOTE — ED Triage Notes (Signed)
Pt reports feeling weak since last night. Pt reports feeling nauseous without vomiting. Pt reports she had burning to the center of her chest.

## 2019-05-04 ENCOUNTER — Emergency Department (HOSPITAL_COMMUNITY)
Admission: EM | Admit: 2019-05-04 | Discharge: 2019-05-05 | Disposition: A | Discharge status: Home / Self Care | Payer: Self-pay | Attending: Emergency Medicine | Admitting: Emergency Medicine

## 2019-05-04 DIAGNOSIS — N760 Acute vaginitis: Secondary | ICD-10-CM | POA: Insufficient documentation

## 2019-05-04 DIAGNOSIS — F17210 Nicotine dependence, cigarettes, uncomplicated: Secondary | ICD-10-CM | POA: Insufficient documentation

## 2019-05-04 DIAGNOSIS — B9689 Other specified bacterial agents as the cause of diseases classified elsewhere: Secondary | ICD-10-CM

## 2019-05-04 DIAGNOSIS — Z79899 Other long term (current) drug therapy: Secondary | ICD-10-CM | POA: Insufficient documentation

## 2019-05-04 DIAGNOSIS — M25561 Pain in right knee: Secondary | ICD-10-CM | POA: Insufficient documentation

## 2019-05-04 DIAGNOSIS — M25562 Pain in left knee: Secondary | ICD-10-CM | POA: Insufficient documentation

## 2019-05-05 ENCOUNTER — Other Ambulatory Visit: Payer: Self-pay | Admitting: Emergency Medicine

## 2019-05-05 ENCOUNTER — Encounter (HOSPITAL_COMMUNITY): Payer: Self-pay

## 2019-05-05 ENCOUNTER — Emergency Department (HOSPITAL_COMMUNITY): Payer: Self-pay

## 2019-05-05 LAB — WET PREP, GENITAL
Sperm: NONE SEEN
Trich, Wet Prep: NONE SEEN
Yeast Wet Prep HPF POC: NONE SEEN

## 2019-05-05 LAB — URINALYSIS, ROUTINE W REFLEX MICROSCOPIC
Bilirubin Urine: NEGATIVE
Glucose, UA: NEGATIVE mg/dL
Ketones, ur: NEGATIVE mg/dL
Leukocytes,Ua: NEGATIVE
Nitrite: NEGATIVE
Protein, ur: NEGATIVE mg/dL
Specific Gravity, Urine: 1.004 — ABNORMAL LOW (ref 1.005–1.030)
pH: 6 (ref 5.0–8.0)

## 2019-05-05 MED ORDER — IBUPROFEN 400 MG PO TABS
600.0000 mg | ORAL_TABLET | Freq: Once | ORAL | Status: AC
  Administered 2019-05-05: 600 mg via ORAL
  Filled 2019-05-05: qty 1

## 2019-05-05 MED ORDER — METRONIDAZOLE 500 MG PO TABS: 500.0000 mg | ORAL_TABLET | Freq: Two times a day (BID) | ORAL | 0 refills | Status: DC

## 2019-05-05 NOTE — ED Notes (Signed)
Pt also adds that she has a vaginal odor that has been going on for a while and wants that checked as well

## 2019-05-05 NOTE — ED Provider Notes (Signed)
[facility] EMERGENCY DEPARTMENT Provider Note   CSN: [id number] Arrival date & time: 05/04/19  2359     History   Chief Complaint Chief Complaint  Patient presents with  . Knee Pain  . Vaginal Discharge    HPI Kaylee White is a 48 y.o. female with a history of obesity, homelessness, cocaine use disorder, alcohol use disorder, major depressive disorder, and tobacco use who presents to the emergency department by EMS with a chief complaint of bilateral knee pain, onset today, L>R.  She characterizes the pain as sharp.  The pain radiates up into the distal part of her thighs.  Pain is worse with movement and walking.  No recent falls or injuries.  She has not been walking more than baseline.  No new issues.  No history of previous knee surgery or injury.  No history of knee injections.  No other arthralgias, fever, chills, numbness, weakness, back pain, abdominal pain, ankle pain, or hip pain.  She also reports that she has been having some malodorous vaginal discharge for the last few weeks.  She reports that she is sexually active with her husband.  Low suspicion for STI.  She states "I know I do not have gonorrhea or chlamydia." No dysuria, hematuria, urinary frequency or hesitancy.  She recently finished a course of antibiotics for UTI.     The history is provided by the patient. No language interpreter was used.    History reviewed. No pertinent past medical history.  There are no active problems to display for this patient.   Past Surgical History:  Procedure Laterality Date  . CHOLECYSTECTOMY       OB History   No obstetric history on file.      Home Medications    Prior to Admission medications   Medication Sig Start Date End Date Taking? Authorizing Provider  metroNIDAZOLE (FLAGYL) 500 MG tablet Take 1 tablet (500 mg total) by mouth 2 (two) times daily. 05/05/19   McDonald, Mia A, PA-C  omeprazole (PRILOSEC) 20 MG capsule Take 1 capsule (20  mg total) by mouth daily. 11/19/17   Palumbo, April, MD    Family History No family history on file.  Social History Social History   Tobacco Use  . Smoking status: Current Some Day Smoker    Packs/day: 0.25    Types: Cigarettes  . Smokeless tobacco: Never Used  Substance Use Topics  . Alcohol use: No  . Drug use: No     Allergies   Aspirin and Tylenol [acetaminophen]   Review of Systems Review of Systems  Constitutional: Negative for activity change, chills and fever.  HENT: Negative for congestion, sinus pressure, sinus pain and sneezing.   Respiratory: Negative for shortness of breath.   Cardiovascular: Negative for chest pain.  Gastrointestinal: Negative for abdominal pain, blood in stool, diarrhea, nausea and vomiting.  Genitourinary: Positive for vaginal discharge. Negative for dysuria.  Musculoskeletal: Positive for arthralgias and myalgias. Negative for back pain, gait problem, joint swelling, neck pain and neck stiffness.  Skin: Negative for rash.  Allergic/Immunologic: Negative for immunocompromised state.  Neurological: Negative for dizziness, syncope, speech difficulty, weakness, light-headedness, numbness and headaches.  Psychiatric/Behavioral: Negative for confusion.   Physical Exam Updated Vital Signs BP 103/68 (BP Location: Right Arm)   Pulse 95   Temp 99.1 F (37.3 C) (Oral)   Resp 18   SpO2 98%   Physical Exam Vitals signs and nursing note reviewed. Exam conducted with a chaperone present.  Constitutional:      General: She is not in acute distress. HENT:     Head: Normocephalic.  Eyes:     Conjunctiva/sclera: Conjunctivae normal.  Neck:     Musculoskeletal: Neck supple.  Cardiovascular:     Rate and Rhythm: Normal rate and regular rhythm.     Heart sounds: No murmur. No friction rub. No gallop.   Pulmonary:     Effort: Pulmonary effort is normal. No respiratory distress.  Abdominal:     General: There is no distension.     Palpations:  Abdomen is soft. There is no mass.     Tenderness: There is no abdominal tenderness. There is no right CVA tenderness, left CVA tenderness, guarding or rebound.     Hernia: No hernia is present.  Genitourinary:    Comments: Chaperoned exam.  No cervical motion tenderness.  No adnexal tenderness or fullness bilaterally.  There was a small amount of thick white-colored discharge in the vaginal vault. Musculoskeletal:     Comments: No overlying redness or warmth to the joints of the bilateral lower extremities.  Tender to palpation over the left quadriceps tendon.  No obvious bulges, masses.  No focal medial or lateral joint line tenderness.  No tenderness over the patellar tendon.   Diffusely tender to palpation to the right knee without focal tenderness over the medial or lateral joint line.  No tenderness over the patellar or quadriceps tendon.  Negative anterior and posterior drawer test bilaterally.  Negative valgus and varus stress test bilaterally.   Full active and passive range of motion of the bilateral ankles, hips, and knees.  Antalgic gait.  Independently moves all digits of the bilateral feet.  Sensation is intact and equal throughout the bilateral lower extremities.  5-5 strength against resistance of the bilateral lower extremities.  DP and PT pulses are 2+ and symmetric.  Skin:    General: Skin is warm.     Capillary Refill: Capillary refill takes less than 2 seconds.     Findings: No rash.  Neurological:     Mental Status: She is alert.  Psychiatric:        Behavior: Behavior normal.      ED Treatments / Results  Labs (all labs ordered are listed, but only abnormal results are displayed) Labs Reviewed  WET PREP, GENITAL - Abnormal; Notable for the following components:      Result Value   Clue Cells Wet Prep HPF POC PRESENT (*)    WBC, Wet Prep HPF POC MODERATE (*)    All other components within normal limits  URINALYSIS, ROUTINE W REFLEX MICROSCOPIC - Abnormal; Notable  for the following components:   Specific Gravity, Urine 1.004 (*)    Hgb urine dipstick SMALL (*)    Bacteria, UA RARE (*)    All other components within normal limits  GC/CHLAMYDIA PROBE AMP ([place]) NOT AT [facility]    EKG None  Radiology Dg Knee Complete 4 Views Left  Result Date: 05/05/2019 CLINICAL DATA:  Left knee pain EXAM: LEFT KNEE - COMPLETE 4+ VIEW COMPARISON:  None. FINDINGS: No fracture or malalignment. The joint spaces are maintained. Trace knee effusion IMPRESSION: 1. No acute osseous abnormality 2. Trace knee effusion Electronically Signed   By: Donavan Foil M.D.   On: 05/05/2019 01:15    Procedures Procedures (including critical care time)  Medications Ordered in ED Medications  ibuprofen (ADVIL) tablet 600 mg (600 mg Oral Given 05/05/19 0102)     Initial  Impression / Assessment and Plan / ED Course  I have reviewed the triage vital signs and the nursing notes.  Pertinent labs & imaging results that were available during my care of the patient were reviewed by me and considered in my medical decision making (see chart for details).        48 year old female with a history of obesity, homelessness, cocaine use disorder, alcohol use disorder, major depressive disorder, and tobacco use who presents to the emergency department with a chief complaint of atraumatic bilateral knee pain, onset earlier today.  No recent injuries or fall.  She also notes that she has been having vaginal discharge for the last few weeks.  On exam, she is able to bear weight on the bilateral lower extremities and is neurologically intact.  No overlying warmth or redness to the joint.  Although she is endorsing pain to both knees, the left is noted to be more painful on exam.  X-ray with trace effusion, but x-rays otherwise unremarkable.  She denies recent constitutional symptoms.  Given atraumatic joint pain, could consider disseminated gonorrhea, but less likely given bilateral nature of  pain and she has no other arthralgias.  Low suspicion for gout or septic arthritis at this time.  Given body habitus, will treat the patient for musculoskeletal pain with ibuprofen and a knee sleeve with orthopedics follow-up.  Wet prep is consistent with bacterial vaginosis.  She declines prophylactic antibiotics for gonorrhea and chlamydia.  Exam is not consistent with PID.  Urinalysis is not consistent with infection.  Will discharge with Flagyl.  The patient is working to get established with a primary care provider.  The patient was discussed with Dr. Eudelia Bunch, attending physician who is in agreement with work-up and plan.  She is hemodynamically stable in no acute distress.  At this time, I feel that no further urgent or emergent work-up is indicated.  Safe for discharge to home with outpatient follow-up.  Final Clinical Impressions(s) / ED Diagnoses   Final diagnoses:  Acute pain of both knees  Bacterial vaginosis    ED Discharge Orders         Ordered    metroNIDAZOLE (FLAGYL) 500 MG tablet  2 times daily     05/05/19 0500           McDonald, Mia A, PA-C 05/05/19 9937    Nira Conn, MD 05/05/19 [phone]

## 2019-05-05 NOTE — Discharge Instructions (Signed)
Thank you for allowing me to care for you today in the Emergency Department.   Take 60 mg of ibuprofen once every 6 hours with food for pain.  Apply an ice pack for 15 to 20 minutes as frequently as needed.  Elevate your leg so that your toes are at or above the level of your nose to help with pain.  Wear the sleeve on your knee as needed to help with pain.  Your pelvic exam was consistent with bacterial vaginosis.  Take 1 tablet of Flagyl 2 times daily for the next week.  Avoid alcohol while taking this medication because it can cause severe vomiting.  Return to the emergency department if your joints get red and hot to the touch, if you develop a rash, fever, chills, if you become unable to walk, or develop other new, concerning symptoms.

## 2019-05-05 NOTE — ED Triage Notes (Signed)
Pt comes via [facility] from home for bilateral knee pain, worse on the left, denies injury

## 2019-05-06 LAB — CERVICOVAGINAL ANCILLARY ONLY
Chlamydia: NEGATIVE
Neisseria Gonorrhea: NEGATIVE

## 2019-06-22 ENCOUNTER — Encounter (HOSPITAL_COMMUNITY): Payer: Self-pay

## 2019-06-22 ENCOUNTER — Emergency Department (HOSPITAL_COMMUNITY)
Admission: EM | Admit: 2019-06-22 | Discharge: 2019-06-22 | Disposition: A | Discharge status: Home / Self Care | Payer: Medicaid Other | Attending: Emergency Medicine | Admitting: Emergency Medicine

## 2019-06-22 ENCOUNTER — Other Ambulatory Visit: Payer: Self-pay

## 2019-06-22 DIAGNOSIS — F17210 Nicotine dependence, cigarettes, uncomplicated: Secondary | ICD-10-CM | POA: Insufficient documentation

## 2019-06-22 DIAGNOSIS — N898 Other specified noninflammatory disorders of vagina: Secondary | ICD-10-CM | POA: Insufficient documentation

## 2019-06-22 LAB — URINALYSIS, ROUTINE W REFLEX MICROSCOPIC
Bilirubin Urine: NEGATIVE
Glucose, UA: NEGATIVE mg/dL
Ketones, ur: 20 mg/dL — AB
Leukocytes,Ua: NEGATIVE
Nitrite: NEGATIVE
Protein, ur: NEGATIVE mg/dL
Specific Gravity, Urine: 1.019 (ref 1.005–1.030)
pH: 5 (ref 5.0–8.0)

## 2019-06-22 LAB — RPR: RPR Ser Ql: NONREACTIVE

## 2019-06-22 LAB — WET PREP, GENITAL
Clue Cells Wet Prep HPF POC: NONE SEEN
Sperm: NONE SEEN
Trich, Wet Prep: NONE SEEN
WBC, Wet Prep HPF POC: NONE SEEN
Yeast Wet Prep HPF POC: NONE SEEN

## 2019-06-22 LAB — HIV ANTIBODY (ROUTINE TESTING W REFLEX): HIV Screen 4th Generation wRfx: NONREACTIVE

## 2019-06-22 LAB — I-STAT BETA HCG BLOOD, ED (MC, WL, AP ONLY): I-stat hCG, quantitative: <5 m[IU]/mL (ref <5)

## 2019-06-22 NOTE — ED Provider Notes (Signed)
[facility] EMERGENCY DEPARTMENT Provider Note   CSN: [id number] Arrival date & time: 06/22/19  1324    History   Chief Complaint Chief Complaint  Patient presents with  . Vaginal odor    HPI Kaylee White is a 48 y.o. female with past medical history significant for BV who presents for evaluation of vaginal odor.  Patient states 1 week ago she was tested and diagnosed with bacterial vaginosis.  She was given a vaginal cream.  Patient states she continues to have vaginal odor and discharge.  She denies any pain.  Denies fever, chills, nausea, vomiting, chest pain, shortness breath, abdominal pain, pelvic pain, dysuria, hematuria, diarrhea, constipation.  Denies additional aggravating or alleviating factors.  She is sexually active with her husband.  She has no concerns for other STDs.   History obtained from patient and past medical records.  No interpreter is used.     HPI  History reviewed. No pertinent past medical history.  There are no active problems to display for this patient.   Past Surgical History:  Procedure Laterality Date  . CHOLECYSTECTOMY       OB History   No obstetric history on file.      Home Medications    Prior to Admission medications   Medication Sig Start Date End Date Taking? Authorizing Provider  metroNIDAZOLE (METROGEL) 0.75 % vaginal gel Place 1 Applicatorful vaginally at bedtime.   Yes [provider]  omeprazole (PRILOSEC) 20 MG capsule Take 1 capsule (20 mg total) by mouth daily. Patient not taking: Reported on 06/22/2019 11/19/17   Randal Buba, April, MD    Family History No family history on file.  Social History Social History   Tobacco Use  . Smoking status: Current Some Day Smoker    Packs/day: 0.25    Types: Cigarettes  . Smokeless tobacco: Never Used  Substance Use Topics  . Alcohol use: No  . Drug use: No     Allergies   Aspirin and Tylenol [acetaminophen]   Review of Systems Review of  Systems  Constitutional: Negative.   HENT: Negative.   Respiratory: Negative.   Cardiovascular: Negative.   Gastrointestinal: Negative.   Genitourinary: Positive for vaginal discharge. Negative for decreased urine volume, difficulty urinating, dysuria, flank pain, frequency, genital sores, hematuria, menstrual problem, pelvic pain, urgency, vaginal bleeding and vaginal pain.  Musculoskeletal: Negative.   Skin: Negative.   Neurological: Negative.   All other systems reviewed and are negative.    Physical Exam Updated Vital Signs BP (!) 117/92   Pulse 82   Temp 98.1 F (36.7 C) (Oral)   Resp 16   SpO2 98%   Physical Exam Vitals signs and nursing note reviewed.  Constitutional:      General: She is not in acute distress.    Appearance: She is well-developed. She is not ill-appearing or toxic-appearing.  HENT:     Head: Normocephalic and atraumatic.     Nose: Nose normal.     Mouth/Throat:     Mouth: Mucous membranes are moist.     Pharynx: Oropharynx is clear.  Eyes:     Pupils: Pupils are equal, round, and reactive to light.  Neck:     Musculoskeletal: Normal range of motion.  Cardiovascular:     Rate and Rhythm: Normal rate.     Pulses: Normal pulses.     Heart sounds: Normal heart sounds.  Pulmonary:     Effort: Pulmonary effort is normal. No respiratory distress.  Breath sounds: Normal breath sounds.  Abdominal:     General: Bowel sounds are normal. There is no distension.  Genitourinary:    Comments: Normal appearing external female genitalia without rashes or lesions, normal vaginal epithelium. Normal appearing cervix with thick white discharge. No cervical petechiae. Cervical os is closed. There is no bleeding noted at the os.No odor. Bimanual: No CMT, nontender.  No palpable adnexal masses or tenderness. Uterus midline and not fixed. Rectovaginal exam was deferred.  No cystocele or rectocele noted. No pelvic lymphadenopathy noted. Wet prep was obtained.   Cultures for gonorrhea and chlamydia collected. Exam performed with chaperone in room. Musculoskeletal: Normal range of motion.        General: No swelling or deformity.     Right lower leg: No edema.     Left lower leg: No edema.  Skin:    General: Skin is warm and dry.     Capillary Refill: Capillary refill takes less than 2 seconds.  Neurological:     Mental Status: She is alert.     Gait: Gait normal.    ED Treatments / Results  Labs (all labs ordered are listed, but only abnormal results are displayed) Labs Reviewed  URINALYSIS, ROUTINE W REFLEX MICROSCOPIC - Abnormal; Notable for the following components:      Result Value   APPearance HAZY (*)    Hgb urine dipstick SMALL (*)    Ketones, ur 20 (*)    Bacteria, UA RARE (*)    All other components within normal limits  WET PREP, GENITAL  URINE CULTURE  RPR  HIV ANTIBODY (ROUTINE TESTING W REFLEX)  I-STAT BETA HCG BLOOD, ED ([facility], [facility], [facility] ONLY)  GC/CHLAMYDIA PROBE AMP ([place]) NOT AT [facility]    EKG None  Radiology No results found.  Procedures Procedures (including critical care time)  Medications Ordered in ED Medications - No data to display   Initial Impression / Assessment and Plan / ED Course  I have reviewed the triage vital signs and the nursing notes.  Pertinent labs & imaging results that were available during my care of the patient were reviewed by me and considered in my medical decision making (see chart for details).  48 year old female appears otherwise well presents for evaluation of vaginal discharge with odor.  She was recently tested for bacterial vaginosis which she is currently doing vaginal suppository creams for.  She denies any pelvic pain.  Her abdomen is soft, nontender without rebound or guarding.  She is tolerating p.o. intake at home.  GU exam with some mild white discharge in vaginal vault however no CMT tenderness or adnexal tenderness.  Wet prep negative.  She does not want empiric  treatment for gonorrhea or chlamydia.  Her HIV and syphilis testing are pending.  Gust with patient follow-up with OB/GYN or PCP for any recurrent symptoms. Pt not concerning for PID because hemodynamically stable and no cervical motion tenderness on pelvic exam. Discussed importance of using protection when sexually active.   The patient has been appropriately medically screened and/or stabilized in the ED. I have low suspicion for any other emergent medical condition which would require further screening, evaluation or treatment in the ED or require inpatient management.      Final Clinical Impressions(s) / ED Diagnoses   Final diagnoses:  Vaginal odor    ED Discharge Orders    None       Riannon Mukherjee A, PA-C 06/22/19 0347    Gwyneth Sprout, MD 06/22/19  1436  

## 2019-06-22 NOTE — Discharge Instructions (Signed)
You will be called with your gonorrhea, chlamydia, HIV and syphilis testing.  If you do not hear from anyone it means these were negative.  Follow-up with your regular primary care provider as needed.

## 2019-06-22 NOTE — ED Triage Notes (Signed)
Patient complains of 6 days of vaginal odor, denies pain, denies discharge. NAD. Has been using a gel with no relief

## 2019-06-23 LAB — URINE CULTURE: Culture: NO GROWTH

## 2019-06-24 LAB — GC/CHLAMYDIA PROBE AMP (~~LOC~~) NOT AT ARMC
Chlamydia: NEGATIVE
Neisseria Gonorrhea: NEGATIVE

## 2019-06-30 ENCOUNTER — Other Ambulatory Visit: Payer: Self-pay

## 2019-06-30 ENCOUNTER — Emergency Department (HOSPITAL_COMMUNITY)
Admission: EM | Admit: 2019-06-30 | Discharge: 2019-06-30 | Disposition: A | Discharge status: Home / Self Care | Payer: Medicaid Other | Attending: Emergency Medicine | Admitting: Emergency Medicine

## 2019-06-30 DIAGNOSIS — N760 Acute vaginitis: Secondary | ICD-10-CM | POA: Insufficient documentation

## 2019-06-30 DIAGNOSIS — F17210 Nicotine dependence, cigarettes, uncomplicated: Secondary | ICD-10-CM | POA: Insufficient documentation

## 2019-06-30 DIAGNOSIS — N898 Other specified noninflammatory disorders of vagina: Secondary | ICD-10-CM

## 2019-06-30 DIAGNOSIS — B9689 Other specified bacterial agents as the cause of diseases classified elsewhere: Secondary | ICD-10-CM

## 2019-06-30 LAB — WET PREP, GENITAL
Sperm: NONE SEEN
Trich, Wet Prep: NONE SEEN
Yeast Wet Prep HPF POC: NONE SEEN

## 2019-06-30 LAB — URINALYSIS, ROUTINE W REFLEX MICROSCOPIC
Bacteria, UA: NONE SEEN
Bilirubin Urine: NEGATIVE
Glucose, UA: NEGATIVE mg/dL
Ketones, ur: NEGATIVE mg/dL
Leukocytes,Ua: NEGATIVE
Nitrite: NEGATIVE
Protein, ur: NEGATIVE mg/dL
Specific Gravity, Urine: 1.004 — ABNORMAL LOW (ref 1.005–1.030)
pH: 6 (ref 5.0–8.0)

## 2019-06-30 MED ORDER — METRONIDAZOLE 500 MG PO TABS: 500.0000 mg | ORAL_TABLET | Freq: Two times a day (BID) | ORAL | 0 refills | Status: DC

## 2019-06-30 NOTE — ED Provider Notes (Signed)
[facility] EMERGENCY DEPARTMENT Provider Note   CSN: [id number] Arrival date & time: 06/30/19  0998     History   Chief Complaint Chief Complaint  Patient presents with  . Vaginal odor    HPI Everlean Bucher Martinek is a 48 y.o. female with h/o recurrent BV here for evaluation of vaginal odor for the last week.  Cannot describes it "it just stinks".  No other symptoms includes vaginal discharge, abnormal vaginal bleeding, genital lesions, dysuria, hematuria, urinary frequency, abdominal or pelvic pain, flank pain, fevers.  States she has had this in the past when she had BV.  She was seen by doctor and diagnosed with BV, treated with antibiotics.  States she had vaginal intercourse with husband and odor returned to came to ER 11/8 for vaginal odor and diagnosed with BV again and treated with antibiotics.  Stopped using fragranced soap now only using water and unscented soap.  No sexual activity since ER visit and treatment 11/8.  No changes in medicines. No changes in topical hygiene products. Not concerned about STD. She states she just wants an antibiotic to get rid of it again.       HPI  No past medical history on file.  There are no active problems to display for this patient.   Past Surgical History:  Procedure Laterality Date  . CHOLECYSTECTOMY       OB History   No obstetric history on file.      Home Medications    Prior to Admission medications   Medication Sig Start Date End Date Taking? Authorizing Provider  metroNIDAZOLE (FLAGYL) 500 MG tablet Take 1 tablet (500 mg total) by mouth 2 (two) times daily. 06/30/19   Kinnie Feil, PA-C  metroNIDAZOLE (METROGEL) 0.75 % vaginal gel Place 1 Applicatorful vaginally at bedtime.    [provider]  omeprazole (PRILOSEC) 20 MG capsule Take 1 capsule (20 mg total) by mouth daily. Patient not taking: Reported on 06/22/2019 11/19/17   Randal Buba, April, MD    Family History No family history on  file.  Social History Social History   Tobacco Use  . Smoking status: Current Some Day Smoker    Packs/day: 0.25    Types: Cigarettes  . Smokeless tobacco: Never Used  Substance Use Topics  . Alcohol use: No  . Drug use: No     Allergies   Aspirin and Tylenol [acetaminophen]   Review of Systems Review of Systems  Genitourinary:       Vaginal odor   All other systems reviewed and are negative.    Physical Exam Updated Vital Signs BP 137/89 (BP Location: Left Arm)   Pulse 78   Temp 98 F (36.7 C) (Oral)   Resp 16   LMP 05/30/2019 (Approximate)   SpO2 99%   Physical Exam Vitals signs and nursing note reviewed.  Constitutional:      Appearance: She is well-developed.     Comments: Non toxic in NAD  HENT:     Head: Normocephalic and atraumatic.     Nose: Nose normal.  Eyes:     Conjunctiva/sclera: Conjunctivae normal.  Neck:     Musculoskeletal: Normal range of motion.  Cardiovascular:     Rate and Rhythm: Normal rate and regular rhythm.  Pulmonary:     Effort: Pulmonary effort is normal.     Breath sounds: Normal breath sounds.  Abdominal:     General: Bowel sounds are normal.     Palpations: Abdomen is  soft.     Tenderness: There is no abdominal tenderness.     Comments: No G/R/R. No suprapubic or CVA tenderness. Negative Murphy's and McBurney's. Active BS to lower quadrants.   Genitourinary:    Comments: Exam performed with RN at bedside for assistance. External genitalia without lesions.  No groin lymphadenopathy.  Vaginal mucosa and cervix pink without lesions.  Scant likely physiologic white discharge adherent to cervix, removed with swab.  No cervical friability or polyps.  No odor (with surgical mask/shield on) No CMT.  Nonpalpable, nontender adnexa.  Perianal skin normal without lesions. Musculoskeletal: Normal range of motion.  Skin:    General: Skin is warm and dry.     Capillary Refill: Capillary refill takes less than 2 seconds.   Neurological:     Mental Status: She is alert.  Psychiatric:        Behavior: Behavior normal.      ED Treatments / Results  Labs (all labs ordered are listed, but only abnormal results are displayed) Labs Reviewed  WET PREP, GENITAL - Abnormal; Notable for the following components:      Result Value   Clue Cells Wet Prep HPF POC PRESENT (*)    WBC, Wet Prep HPF POC MODERATE (*)    All other components within normal limits  URINALYSIS, ROUTINE W REFLEX MICROSCOPIC - Abnormal; Notable for the following components:   Specific Gravity, Urine 1.004 (*)    Hgb urine dipstick SMALL (*)    All other components within normal limits  URINE CULTURE  GC/CHLAMYDIA PROBE AMP ([place]) NOT AT [facility]    EKG None  Radiology No results found.  Procedures Procedures (including critical care time)  Medications Ordered in ED Medications - No data to display   Initial Impression / Assessment and Plan / ED Course  I have reviewed the triage vital signs and the nursing notes.  Pertinent labs & imaging results that were available during my care of the patient were reviewed by me and considered in my medical decision making (see chart for details).  Clinical Course as of Jun 29 1204  Mon Jun 30, 2019  1151 Clue Cells Wet Prep HPF POC(!): PRESENT [CG]    Clinical Course User Index [CG] Liberty Handy, PA-C   Wet prep confirms clue cells which fit clinical picture.  Will discharge with Flagyl.  Gonorrhea, chlamydia swabs pending but patient is low risk and recently tested negative for this with no recent sexual encounters.  She declined HIV and RPR testing.  She declined empiric treatment for STDs.  No signs of PID on exam.  Vital signs normal.  No abdominal or pelvic pain.  No vaginal bleeding.  No genital lesions.  Discussed importance of appropriate hygiene.  Recommended PCP and GYN follow-up for recurrent BV.  Return precautions discussed.  Final Clinical Impressions(s) / ED  Diagnoses   Final diagnoses:  Vaginal odor  Bacterial vaginitis    ED Discharge Orders         Ordered    metroNIDAZOLE (FLAGYL) 500 MG tablet  2 times daily     06/30/19 1151           Liberty Handy, [place] 06/30/19 1206    Derwood Kaplan, MD 07/01/19 [phone]

## 2019-06-30 NOTE — ED Triage Notes (Signed)
Pt endorses recent bacterial vaginosis and was treated for it, rechecked on 11/08 and was clear but complains that she has continued to have a bad vaginal odor since then. Has no other complaints. VSS.

## 2019-06-30 NOTE — Discharge Instructions (Addendum)
You were seen in the ER for vaginal odor  Swabs showed bacterial vaginosis again  I have sent a prescription for metronidazole (antibiotic) to treat it again  Check in with your doctor in 1 week to ensure symptoms are improving, you may need referral to OBGYN to determine cause of recurrent bacterial vaginosis  Return for worsening symptoms, fever, abdominal or pelvic pain, vaginal discharge, urinary symptoms

## 2019-07-01 LAB — URINE CULTURE: Culture: <10000 — AB

## 2019-07-01 LAB — GC/CHLAMYDIA PROBE AMP (~~LOC~~) NOT AT ARMC
Chlamydia: NEGATIVE
Neisseria Gonorrhea: NEGATIVE

## 2019-07-05 ENCOUNTER — Other Ambulatory Visit: Payer: Self-pay

## 2019-07-05 ENCOUNTER — Encounter (HOSPITAL_COMMUNITY): Payer: Self-pay

## 2019-07-05 ENCOUNTER — Emergency Department (HOSPITAL_COMMUNITY)
Admission: EM | Admit: 2019-07-05 | Discharge: 2019-07-05 | Disposition: A | Discharge status: Home / Self Care | Payer: Medicaid Other | Attending: Emergency Medicine | Admitting: Emergency Medicine

## 2019-07-05 DIAGNOSIS — N898 Other specified noninflammatory disorders of vagina: Secondary | ICD-10-CM | POA: Insufficient documentation

## 2019-07-05 DIAGNOSIS — F17210 Nicotine dependence, cigarettes, uncomplicated: Secondary | ICD-10-CM | POA: Insufficient documentation

## 2019-07-05 LAB — WET PREP, GENITAL
Clue Cells Wet Prep HPF POC: NONE SEEN
Sperm: NONE SEEN
Trich, Wet Prep: NONE SEEN
Yeast Wet Prep HPF POC: NONE SEEN

## 2019-07-05 NOTE — ED Provider Notes (Signed)
[facility]-EMERGENCY DEPT Provider Note   CSN: [id number] Arrival date & time: 07/05/19  7829     History   Chief Complaint Chief Complaint  Patient presents with  . Vaginal odor    HPI Kaylee White is a 48 y.o. female has medical history significant for recurrent bacterial vaginosis presents to emergency department today for chief complaint of vaginal odor x2 weeks.  Patient denies any vaginal discharge but states she has noticed a strong fishy odor.  Patient has had multiple evaluations for the same.  She was seen by PCP and diagnosed with BV and treated with Flagyl gel.  After that did not help her symptoms she was seen in the emergency department x5 days ago and treated with p.o. Flagyl.  She states despite taking this antibiotic her symptoms have persisted.  She states she has not been sexually active since 06/22/2019. She admits to use OTC douching in the beginning of the month which she thinks led to her symptoms. She reports showering multiple times per day in attempt to get rid of the odor.  Also denies fever, chills, flank pain, abnormal vaginal bleeding, genital lesions, dysuria, gross hematuria, urinary frequency,   History reviewed. No pertinent past medical history.  There are no active problems to display for this patient.   Past Surgical History:  Procedure Laterality Date  . CHOLECYSTECTOMY       OB History   No obstetric history on file.      Home Medications    Prior to Admission medications   Medication Sig Start Date End Date Taking? Authorizing Provider  metroNIDAZOLE (FLAGYL) 500 MG tablet Take 1 tablet (500 mg total) by mouth 2 (two) times daily. 06/30/19  Yes Liberty Handy, PA-C    Family History History reviewed. No pertinent family history.  Social History Social History   Tobacco Use  . Smoking status: Current Some Day Smoker    Packs/day: 0.25    Types: Cigarettes  . Smokeless tobacco: Never Used   Substance Use Topics  . Alcohol use: No  . Drug use: No     Allergies   Aspirin and Tylenol [acetaminophen]   Review of Systems Review of Systems  Constitutional: Negative for chills and fever.  Genitourinary: Negative for dysuria, frequency, genital sores, hematuria, menstrual problem, pelvic pain, vaginal bleeding, vaginal discharge and vaginal pain.  All other systems reviewed and are negative.    Physical Exam Updated Vital Signs BP 126/87 (BP Location: Left Arm)   Pulse 78   Temp 98.2 F (36.8 C) (Oral)   Resp 18   Ht 5' (1.524 m)   Wt 73 kg   LMP 05/15/2019 (Approximate)   SpO2 98%   BMI 31.44 kg/m   Physical Exam Vitals signs and nursing note reviewed.  Constitutional:      General: She is not in acute distress.    Appearance: She is not ill-appearing.  HENT:     Head: Normocephalic and atraumatic.     Right Ear: Tympanic membrane and external ear normal.     Left Ear: Tympanic membrane and external ear normal.     Nose: Nose normal.     Mouth/Throat:     Mouth: Mucous membranes are moist.     Pharynx: Oropharynx is clear.  Eyes:     General: No scleral icterus.       Right eye: No discharge.        Left eye: No discharge.  Extraocular Movements: Extraocular movements intact.     Conjunctiva/sclera: Conjunctivae normal.     Pupils: Pupils are equal, round, and reactive to light.  Neck:     Musculoskeletal: Normal range of motion.     Vascular: No JVD.  Cardiovascular:     Rate and Rhythm: Normal rate and regular rhythm.     Pulses: Normal pulses.          Radial pulses are 2+ on the right side and 2+ on the left side.     Heart sounds: Normal heart sounds.  Pulmonary:     Comments: Lungs clear to auscultation in all fields. Symmetric chest rise. No wheezing, rales, or rhonchi. Abdominal:     Comments: Abdomen is soft, non-distended, and non-tender in all quadrants. No rigidity, no guarding. No peritoneal signs.  Genitourinary:    Comments:  Normal external genitalia. No pain with speculum insertion. Closed cervical os with normal appearance - no rash or lesions. No significant discharge or bleeding noted from cervix or in vaginal vault. On bimanual examination no adnexal tenderness or cervical motion tenderness. Chaperone Cog[place]ellShelby NT present during exam.  Musculoskeletal: Normal range of motion.  Skin:    General: Skin is warm and dry.     Capillary Refill: Capillary refill takes less than 2 seconds.  Neurological:     Mental Status: She is oriented to person, place, and time.     GCS: GCS eye subscore is 4. GCS verbal subscore is 5. GCS motor subscore is 6.     Comments: Fluent speech, no facial droop.  Psychiatric:        Behavior: Behavior normal.      ED Treatments / Results  Labs (all labs ordered are listed, but only abnormal results are displayed) Labs Reviewed  WET PREP, GENITAL - Abnormal; Notable for the following components:      Result Value   WBC, Wet Prep HPF POC MODERATE (*)    All other components within normal limits    EKG None  Radiology No results found.  Procedures Procedures (including critical care time)  Medications Ordered in ED Medications - No data to display   Initial Impression / Assessment and Plan / ED Course  I have reviewed the triage vital signs and the nursing notes.  Pertinent labs & imaging results that were available during my care of the patient were reviewed by me and considered in my medical decision making (see chart for details).  Patient seen and examined. Patient nontoxic appearing, in no apparent distress, vitals WNL.  No abdominal tenderness on exam.  No pelvic pain.  Pelvic exam performed with wet prep only as patient had recent pelvic exam with STD testing.  There is no vaginal discharge seen on exam.  Wet prep is negative for clue cells.  No cervical motion tenderness or adnexal tenderness on exam, very unlikely PID.  Discussed results with patient. Had lengthy  discussion with patient regarding follow up with gyn for persistent vaginal odor. Her reassuring exam and vitals do not indicate need for imaging. Recommend and GYN follow-up given she has history of recurrent BV.  Patient given information for women's walk-in clinic given she is worried about finding GYN provider she does not insurance.  Patient is stable to be discharged home.  Return precautions discussed.    Portions of this note were generated with Scientist, clinical (histocompatibility and immunogenetics)Dragon dictation software. Dictation errors may occur despite best attempts at proofreading.    Final Clinical Impressions(s) / ED Diagnoses  Final diagnoses:  Vaginal odor    ED Discharge Orders    None       Flint Melter 07/05/19 1158    Davonna Belling, MD 07/05/19 [phone]

## 2019-07-05 NOTE — Discharge Instructions (Addendum)
You have been seen today for vaginal odor. Please read and follow all provided instructions. Return to the emergency room for worsening condition or new concerning symptoms.    Your exam today does not show any signs bacterial vaginosis  1. Medications:  Continue usual home medications  Take medications as prescribed. Please review all of the medicines and only take them if you do not have an allergy to them.   2. Treatment: rest, drink plenty of fluids  3. Follow Up: Please follow up with the women's outpatient clinic.  You can call the phone number listed in your discharge paperwork to schedule follow-up appointment.  The clinic also has a walk-in evening clinic from Monday to Wednesday from 430-730 pm, no appointments are required during this time.  It is also a possibility that you have an allergic reaction to any of the medicines that you have been prescribed - Everybody reacts differently to medications and while MOST people have no trouble with most medicines, you may have a reaction such as nausea, vomiting, rash, swelling, shortness of breath. If this is the case, please stop taking the medicine immediately and contact your physician.  ?

## 2019-07-05 NOTE — ED Triage Notes (Signed)
Pt presents with c/o vaginal odor. Pt reports that she has been seen multiple times for same and has received gel and antibiotics as she was diagnosed with bacterial vaginosis but nothing is clearing up the odor. Pt reports she has been instructed to see an OBGYN but is unable to as she has no insurance. Pt requesting an ultrasound.

## 2019-07-09 ENCOUNTER — Encounter (HOSPITAL_COMMUNITY): Payer: Self-pay | Admitting: Emergency Medicine

## 2019-07-09 ENCOUNTER — Emergency Department (HOSPITAL_COMMUNITY)
Admission: EM | Admit: 2019-07-09 | Discharge: 2019-07-09 | Disposition: A | Discharge status: Home / Self Care | Payer: Self-pay | Attending: Emergency Medicine | Admitting: Emergency Medicine

## 2019-07-09 DIAGNOSIS — M545 Low back pain, unspecified: Secondary | ICD-10-CM

## 2019-07-09 DIAGNOSIS — N898 Other specified noninflammatory disorders of vagina: Secondary | ICD-10-CM | POA: Insufficient documentation

## 2019-07-09 LAB — URINALYSIS, ROUTINE W REFLEX MICROSCOPIC
Bacteria, UA: NONE SEEN
Bilirubin Urine: NEGATIVE
Glucose, UA: NEGATIVE mg/dL
Ketones, ur: NEGATIVE mg/dL
Nitrite: NEGATIVE
Protein, ur: NEGATIVE mg/dL
Specific Gravity, Urine: 1.017 (ref 1.005–1.030)
pH: 5 (ref 5.0–8.0)

## 2019-07-09 LAB — WET PREP, GENITAL
Clue Cells Wet Prep HPF POC: NONE SEEN
Sperm: NONE SEEN
Trich, Wet Prep: NONE SEEN
Yeast Wet Prep HPF POC: NONE SEEN

## 2019-07-09 LAB — PREGNANCY, URINE: Preg Test, Ur: NEGATIVE

## 2019-07-09 NOTE — ED Notes (Signed)
Pt. Stated, she has trichonosis and was suppose to get a shot and I didn't have transportation and Im just tired of this odor and need the shot.

## 2019-07-09 NOTE — ED Provider Notes (Signed)
[place] EMERGENCY DEPARTMENT Provider Note   CSN: [id number] Arrival date & time: 07/09/19  6144     History   Chief Complaint Chief Complaint  Patient presents with  . Back Pain    HPI Kaylee White is a 48 y.o. female.     Kaylee White is a 48 y.o. female who arrives via EMS for evaluation of back pain and vaginal odor.  Patient has been seen in the ED multiple times for vaginal odor over the past month.  Reports that she has been treated for BV and completed course of antibiotics but the odor continues to persist.  She was seen on 11/21 at that time wet prep had white blood cells, but no evidence of clue cells.  Patient reports that earlier in the month she was douching to try and get rid of vaginal odor, she has stopped this but reports that she is showering 2-3 times a day to get rid of the odor.  Patient reports she has not noticed any vaginal discharge, denies vaginal bleeding or reports she has irregular menstrual cycles.  Patient has been referred for follow-up with OB/GYN, she states that she missed the walk-in clinic hours yesterday, but states that her nurse told her that she could have trich.  Patient is in tears regarding this vaginal odor which she states she cannot get to go away, patient repeatedly stating that "she needs a shot".  Patient reports that she has been having an ache in her lower back intermittently for the past 2 months and she states that she knows this is related to her vaginal odor.  She denies any dysuria or urinary frequency, no flank pain or abdominal pain.  Pain is worse with movement.  She denies any numbness weakness or tingling in her lower extremities and no loss of bowel or bladder control.  She has not taken anything to treat her back pain.     History reviewed. No pertinent past medical history.  There are no active problems to display for this patient.   Past Surgical History:  Procedure Laterality Date  .  CHOLECYSTECTOMY       OB History   No obstetric history on file.      Home Medications    Prior to Admission medications   Medication Sig Start Date End Date Taking? Authorizing Provider  metroNIDAZOLE (FLAGYL) 500 MG tablet Take 1 tablet (500 mg total) by mouth 2 (two) times daily. 06/30/19   Kinnie Feil, PA-C    Family History No family history on file.  Social History Social History   Tobacco Use  . Smoking status: Current Some Day Smoker    Packs/day: 0.25    Types: Cigarettes  . Smokeless tobacco: Never Used  Substance Use Topics  . Alcohol use: No  . Drug use: No     Allergies   Aspirin and Tylenol [acetaminophen]   Review of Systems Review of Systems  Constitutional: Negative for chills and fever.  HENT: Negative.   Respiratory: Negative for shortness of breath.   Cardiovascular: Negative for chest pain.  Gastrointestinal: Negative for abdominal pain, constipation, diarrhea, nausea and vomiting.  Genitourinary: Negative for dysuria, flank pain, frequency, genital sores, hematuria, menstrual problem, pelvic pain, vaginal bleeding and vaginal discharge.       Vaginal odor  Musculoskeletal: Positive for back pain. Negative for arthralgias, gait problem, joint swelling, myalgias and neck pain.  Skin: Negative for color change, rash and wound.  Neurological:  Negative for weakness and numbness.     Physical Exam Updated Vital Signs BP 118/89 (BP Location: Right Arm)   Pulse 78   Temp 98.7 F (37.1 C) (Oral)   Resp 15   SpO2 98%   Physical Exam Vitals signs and nursing note reviewed. Exam conducted with a chaperone present.  Constitutional:      General: She is not in acute distress.    Appearance: She is well-developed. She is not diaphoretic.     Comments: Tearful, but well-appearing and in no distress  HENT:     Head: Atraumatic.  Eyes:     General:        Right eye: No discharge.        Left eye: No discharge.  Neck:      Musculoskeletal: Neck supple.  Cardiovascular:     Pulses:          Radial pulses are 2+ on the right side and 2+ on the left side.       Dorsalis pedis pulses are 2+ on the right side and 2+ on the left side.       Posterior tibial pulses are 2+ on the right side and 2+ on the left side.  Pulmonary:     Effort: Pulmonary effort is normal. No respiratory distress.  Abdominal:     General: Bowel sounds are normal. There is no distension.     Palpations: Abdomen is soft. There is no mass.     Tenderness: There is no abdominal tenderness. There is no guarding.     Comments: Abdomen soft, nondistended, nontender to palpation in all quadrants without guarding or peritoneal signs, no CVA tenderness bilaterally  Genitourinary:    Comments: Chaperone present during pelvic exam. No external genital lesions noted. Speculum exam reveals a small amount of white discharge, there is no cervical erythema or friability. Bimanual exam reveals no cervical motion tenderness, no uterine or adnexal tenderness bilaterally.  No palpable masses. Musculoskeletal:     Comments: Tenderness to palpation over the low back musculature without focal tenderness at midline.  Ambulatory and moving lower extremities without difficulty  Skin:    General: Skin is warm and dry.     Capillary Refill: Capillary refill takes less than 2 seconds.  Neurological:     Mental Status: She is alert and oriented to person, place, and time.     Comments: Alert, clear speech, following commands. Moving all extremities without difficulty. Ambulatory with steady gait  Psychiatric:        Mood and Affect: Mood normal.        Behavior: Behavior normal.      ED Treatments / Results  Labs (all labs ordered are listed, but only abnormal results are displayed) Labs Reviewed  WET PREP, GENITAL - Abnormal; Notable for the following components:      Result Value   WBC, Wet Prep HPF POC FEW (*)    All other components within normal  limits  URINALYSIS, ROUTINE W REFLEX MICROSCOPIC - Abnormal; Notable for the following components:   Color, Urine AMBER (*)    Hgb urine dipstick SMALL (*)    Leukocytes,Ua TRACE (*)    All other components within normal limits  PREGNANCY, URINE  GC/CHLAMYDIA PROBE AMP ([place]) NOT AT [facility]    EKG None  Radiology No results found.  Procedures Procedures (including critical care time)  Medications Ordered in ED Medications - No data to display   Initial Impression / Assessment  and Plan / ED Course  I have reviewed the triage vital signs and the nursing notes.  Pertinent labs & imaging results that were available during my care of the patient were reviewed by me and considered in my medical decision making (see chart for details).  48 year old female presents for evaluation of low back pain and continued evaluation for vaginal odor, she has been seen for vaginal odor 3 times this month, was originally treated for BV.  On her exam today she has a very small amount of white discharge, no erythema of the vaginal walls or evidence of cervical inflammation.  Bimanual exam without tenderness to suggest PID or adnexal mass.  She has some mild tenderness across the low back musculature which I suspect is musculoskeletal pain, she has no urinary symptoms and urine without signs of infection today.  Her wet prep today shows only a few WBCs, no evidence of clue cells, trichomoniasis or yeast, this is the best her wet prep has looked in her for visits for vaginal odor.  I discussed this reassuring information with the patient.  We will have her shower once daily with an unscented soap, I discussed with her that showering multiple times a day may be worsening odor and affecting pH balance of the vagina.  I have stressed to her the importance of OB/GYN follow-up regarding this ongoing issue.  Tylenol, Motrin, ice and heat as needed for musculoskeletal back pain.  Return precautions discussed.   Patient expresses understanding and agreement with plan.  Discharged home in good condition.  Final Clinical Impressions(s) / ED Diagnoses   Final diagnoses:  Vaginal odor  Acute bilateral low back pain without sciatica    ED Discharge Orders    None       Legrand RamsFord, Doryan Bahl N, PA-C 07/09/19 1029    Jacalyn LefevreHaviland, Julie, MD 07/09/19 1110

## 2019-07-09 NOTE — ED Triage Notes (Signed)
EMS stated, he is having back pain for 2 months.

## 2019-07-09 NOTE — Discharge Instructions (Signed)
Your wet prep looks good today and does not show evidence of BV, trichomoniasis or yeast infection, I encourage you to just shower once daily with an unscented soap and to follow-up with OB/GYN if you continue to notice odor.  Your wet prep suggest that this is clearing up compared to previous visits.  Regarding back pain use ibuprofen, Tylenol, ice and heat, if you notice weakness numbness or loss of bowel or bladder control return to the ED for reevaluation.

## 2019-07-11 LAB — GC/CHLAMYDIA PROBE AMP (~~LOC~~) NOT AT ARMC
Chlamydia: NEGATIVE
Neisseria Gonorrhea: NEGATIVE

## 2019-07-14 ENCOUNTER — Other Ambulatory Visit (HOSPITAL_COMMUNITY)
Admission: RE | Admit: 2019-07-14 | Discharge: 2019-07-14 | Disposition: A | Discharge status: Home / Self Care | Payer: Medicaid Other | Source: Ambulatory Visit | Attending: Family Medicine | Admitting: Family Medicine

## 2019-07-14 ENCOUNTER — Other Ambulatory Visit: Payer: Self-pay

## 2019-07-14 ENCOUNTER — Ambulatory Visit (INDEPENDENT_AMBULATORY_CARE_PROVIDER_SITE_OTHER): Payer: Self-pay

## 2019-07-14 DIAGNOSIS — N898 Other specified noninflammatory disorders of vagina: Secondary | ICD-10-CM | POA: Diagnosis present

## 2019-07-14 DIAGNOSIS — B3731 Acute candidiasis of vulva and vagina: Secondary | ICD-10-CM

## 2019-07-14 DIAGNOSIS — B373 Candidiasis of vulva and vagina: Secondary | ICD-10-CM

## 2019-07-14 MED ORDER — LACTINEX PO CHEW: 1.0000 | CHEWABLE_TABLET | Freq: Three times a day (TID) | ORAL | 9 refills | Status: DC

## 2019-07-14 MED ORDER — FLUCONAZOLE 150 MG PO TABS: 150.0000 mg | ORAL_TABLET | ORAL | 0 refills | Status: AC

## 2019-07-14 NOTE — Patient Instructions (Signed)
Vaginal Yeast infection, Adult  Vaginal yeast infection is a condition that causes vaginal discharge as well as soreness, swelling, and redness (inflammation) of the vagina. This is a common condition. Some women get this infection frequently. What are the causes? This condition is caused by a change in the normal balance of the yeast (candida) and bacteria that live in the vagina. This change causes an overgrowth of yeast, which causes the inflammation. What increases the risk? The condition is more likely to develop in women who:  Take antibiotic medicines.  Have diabetes.  Take birth control pills.  Are pregnant.  Douche often.  Have a weak body defense system (immune system).  Have been taking steroid medicines for a long time.  Frequently wear tight clothing. What are the signs or symptoms? Symptoms of this condition include:  White, thick, creamy vaginal discharge.  Swelling, itching, redness, and irritation of the vagina. The lips of the vagina (vulva) may be affected as well.  Pain or a burning feeling while urinating.  Pain during sex. How is this diagnosed? This condition is diagnosed based on:  Your medical history.  A physical exam.  A pelvic exam. Your health care provider will examine a sample of your vaginal discharge under a microscope. Your health care provider may send this sample for testing to confirm the diagnosis. How is this treated? This condition is treated with medicine. Medicines may be over-the-counter or prescription. You may be told to use one or more of the following:  Medicine that is taken by mouth (orally).  Medicine that is applied as a cream (topically).  Medicine that is inserted directly into the vagina (suppository). Follow these instructions at home:  Lifestyle  Do not have sex until your health care provider approves. Tell your sex partner that you have a yeast infection. That person should go to his or her health care  provider and ask if they should also be treated.  Do not wear tight clothes, such as pantyhose or tight pants.  Wear breathable cotton underwear. General instructions  Take or apply over-the-counter and prescription medicines only as told by your health care provider.  Eat more yogurt. This may help to keep your yeast infection from returning.  Do not use tampons until your health care provider approves.  Try taking a sitz bath to help with discomfort. This is a warm water bath that is taken while you are sitting down. The water should only come up to your hips and should cover your buttocks. Do this 3-4 times per day or as told by your health care provider.  Do not douche.  If you have diabetes, keep your blood sugar levels under control.  Keep all follow-up visits as told by your health care provider. This is important. Contact a health care provider if:  You have a fever.  Your symptoms go away and then return.  Your symptoms do not get better with treatment.  Your symptoms get worse.  You have new symptoms.  You develop blisters in or around your vagina.  You have blood coming from your vagina and it is not your menstrual period.  You develop pain in your abdomen. Summary  Vaginal yeast infection is a condition that causes discharge as well as soreness, swelling, and redness (inflammation) of the vagina.  This condition is treated with medicine. Medicines may be over-the-counter or prescription.  Take or apply over-the-counter and prescription medicines only as told by your health care provider.  Do not douche.   Do not have sex or use tampons until your health care provider approves.  Contact a health care provider if your symptoms do not get better with treatment or your symptoms go away and then return. This information is not intended to replace advice given to you by your health care provider. Make sure you discuss any questions you have with your health care  provider. Document Released: 05/10/2005 Document Revised: 12/17/2017 Document Reviewed: 12/17/2017 Elsevier Patient Education  2020 Elsevier Inc.  

## 2019-07-14 NOTE — Progress Notes (Signed)
   GYNECOLOGY OFFICE NOTE  History:  48 y.o. No obstetric history on file. here today for vaginal odor. She was recently treated for BV, is sexually active with only her husband. Reports the odor has been there for a few weeks, says it smells like sour bread.  No dyspareunia, vaginal bleeding, or discharge noted.  No past medical history on file.  Past Surgical History:  Procedure Laterality Date  . CHOLECYSTECTOMY       Current Outpatient Medications:  .  metroNIDAZOLE (FLAGYL) 500 MG tablet, Take 1 tablet (500 mg total) by mouth 2 (two) times daily., Disp: 14 tablet, Rfl: 0  The following portions of the patient's history were reviewed and updated as appropriate: allergies, current medications, past family history, past medical history, past social history, past surgical history and problem list.   Review of Systems:  Pertinent items noted in HPI and remainder of comprehensive ROS otherwise negative.   Objective:  Physical Exam There were no vitals taken for this visit. CONSTITUTIONAL: Well-developed, well-nourished female in no acute distress.  HENT:  Normocephalic, atraumatic. External right and left ear normal. Oropharynx is clear and moist EYES: Conjunctivae and EOM are normal. Pupils are equal, round, and reactive to light. No scleral icterus.  NECK: Normal range of motion, supple, no masses SKIN: Skin is warm and dry. No rash noted. Not diaphoretic. No erythema. No pallor. NEUROLOGIC: Alert and oriented to person, place, and time. Normal reflexes, muscle tone coordination. No cranial nerve deficit noted. PSYCHIATRIC: Normal mood and affect. Normal behavior. Normal judgment and thought content. CARDIOVASCULAR: Normal heart rate noted RESPIRATORY: Effort and breath sounds normal, no problems with respiration noted ABDOMEN: Soft, no distention noted.   PELVIC: Normal appearing external genitalia; normal appearing vaginal mucosa and cervix.  White, curd-like discharge noted.  Normal uterine size, no other palpable masses, no uterine or adnexal tenderness. MUSCULOSKELETAL: Normal range of motion. No edema noted.  Exam done with chaperone present.  Labs and Imaging No results found.  Assessment & Plan:  1. Vaginal discharge - Cervicovaginal ancillary only( [place])  2. Vaginal candidiasis - fluconazole (DIFLUCAN) 150 MG tablet; Take 1 tablet (150 mg total) by mouth every 3 (three) days for 2 doses.  Dispense: 2 tablet; Refill: 0 - lactobacillus acidophilus & bulgar (LACTINEX) chewable tablet; Chew 1 tablet by mouth 3 (three) times daily with meals.  Dispense: 90 tablet; Refill: 9  Routine preventative health maintenance measures emphasized. Please refer to After Visit Summary for other counseling recommendations.   No follow-ups on file.  Total face-to-face time with patient: 10 minutes. Over 50% of encounter was spent on counseling and coordination of care.  Merilyn Baba, DO OB Fellow, Faculty [facility] 07/14/2019 5:34 PM

## 2019-07-16 LAB — CERVICOVAGINAL ANCILLARY ONLY
Bacterial Vaginitis (gardnerella): POSITIVE — AB
Candida Glabrata: NEGATIVE
Candida Vaginitis: NEGATIVE
Chlamydia: NEGATIVE
Comment: NEGATIVE
Comment: NEGATIVE
Comment: NEGATIVE
Comment: NEGATIVE
Comment: NEGATIVE
Comment: NORMAL
Neisseria Gonorrhea: NEGATIVE
Trichomonas: NEGATIVE

## 2019-07-17 ENCOUNTER — Telehealth: Payer: Self-pay

## 2019-07-17 MED ORDER — METRONIDAZOLE 500 MG PO TABS: 500.0000 mg | ORAL_TABLET | Freq: Two times a day (BID) | ORAL | 0 refills | Status: DC

## 2019-07-17 MED ORDER — FLUCONAZOLE 150 MG PO TABS: 150.0000 mg | ORAL_TABLET | Freq: Once | ORAL | 0 refills | Status: AC

## 2019-07-17 NOTE — Addendum Note (Signed)
Addended by: Vidal Schwalbe on: 07/17/2019 08:39 AM   Modules accepted: Orders

## 2019-07-17 NOTE — Telephone Encounter (Addendum)
-----   Message from Merilyn Baba, DO sent at 07/17/2019  8:40 AM EST ----- Script sent for flagyl to patient's pharmacy  Notified pt BV results and the need for Flagyl treatment that was sent to her [facility] on [address].  Pt also requested to have Diflucan ordered so that she can have it if she gets a yeast infection.  Diflucan e-prescribed as well.    Mel Almond, RN 07/17/19

## 2019-08-15 DIAGNOSIS — R7303 Prediabetes: Secondary | ICD-10-CM

## 2019-08-15 HISTORY — DX: Prediabetes: R73.03

## 2019-10-27 ENCOUNTER — Ambulatory Visit (INDEPENDENT_AMBULATORY_CARE_PROVIDER_SITE_OTHER): Payer: Self-pay | Admitting: General Practice

## 2019-10-27 ENCOUNTER — Other Ambulatory Visit: Payer: Self-pay

## 2019-10-27 DIAGNOSIS — Z111 Encounter for screening for respiratory tuberculosis: Secondary | ICD-10-CM

## 2019-10-28 NOTE — Progress Notes (Signed)
Opened in error

## 2019-10-31 ENCOUNTER — Other Ambulatory Visit: Payer: Self-pay

## 2019-10-31 ENCOUNTER — Ambulatory Visit (INDEPENDENT_AMBULATORY_CARE_PROVIDER_SITE_OTHER): Payer: Self-pay

## 2019-10-31 DIAGNOSIS — Z111 Encounter for screening for respiratory tuberculosis: Secondary | ICD-10-CM

## 2019-10-31 MED ORDER — TUBERCULIN PPD 5 UNIT/0.1ML ID SOLN
5.0000 [IU] | Freq: Once | INTRADERMAL | Status: AC
  Administered 2019-10-31: 5 [IU] via INTRADERMAL

## 2019-10-31 NOTE — Progress Notes (Signed)
Chart reviewed - agree with CMA/RN documentation.  ° °

## 2019-10-31 NOTE — Progress Notes (Signed)
Pt here today for PPD placement.  PPD injected into left forearm.  Pt tolerated well.  Pt advised to schedule appt on Monday morning for PPD reading.  Pt explained that if she is unable to make it on Monday then we will have to administer test again.  Pt verbalized understanding with no further questions.   Leonette Nutting 10/31/19

## 2019-11-03 ENCOUNTER — Encounter: Payer: Self-pay | Admitting: *Deleted

## 2019-11-03 ENCOUNTER — Other Ambulatory Visit: Payer: Self-pay

## 2019-11-03 ENCOUNTER — Ambulatory Visit (INDEPENDENT_AMBULATORY_CARE_PROVIDER_SITE_OTHER): Payer: Self-pay | Admitting: *Deleted

## 2019-11-03 DIAGNOSIS — Z111 Encounter for screening for respiratory tuberculosis: Secondary | ICD-10-CM

## 2019-11-03 DIAGNOSIS — Z712 Person consulting for explanation of examination or test findings: Secondary | ICD-10-CM

## 2019-11-03 NOTE — Progress Notes (Signed)
Chart reviewed for nurse visit. Agree with plan of care.   Judeth Horn, NP 11/03/2019 11:26 AM

## 2019-11-03 NOTE — Progress Notes (Signed)
Pt presented for reading of PPD skin test which was administered on 3/19. Site on Lt forearm assessed and was found to be negative for swelling or induration. Letter stating test results was composed and given to patient.

## 2019-12-18 ENCOUNTER — Ambulatory Visit: Payer: Medicaid Other | Attending: Internal Medicine

## 2019-12-18 DIAGNOSIS — Z23 Encounter for immunization: Secondary | ICD-10-CM

## 2019-12-18 NOTE — Progress Notes (Signed)
   Covid-19 Vaccination Clinic  Name:  Kaylee White    MRN: [id number] DOB: 02/12/1971  12/18/2019  Kaylee White was observed post Covid-19 immunization for 15 minutes without incident. She was provided with Vaccine Information Sheet and instruction to access the V-Safe system.   Kaylee White was instructed to call 911 with any severe reactions post vaccine: Marland Kitchen Difficulty breathing  . Swelling of face and throat  . A fast heartbeat  . A bad rash all over body  . Dizziness and weakness   Immunizations Administered    Name Date Dose VIS Date Route   Pfizer COVID-19 Vaccine 12/18/2019  8:18 AM 0.3 mL 10/08/2018 Intramuscular   Manufacturer: [employer], [employer]   Lot: [id number]   NDC: [address]-8

## 2020-01-13 ENCOUNTER — Ambulatory Visit: Payer: Medicaid Other | Attending: Internal Medicine

## 2020-01-13 DIAGNOSIS — Z23 Encounter for immunization: Secondary | ICD-10-CM

## 2020-01-13 NOTE — Progress Notes (Signed)
   Covid-19 Vaccination Clinic  Name:  ITATI BROCKSMITH    MRN: [id number] DOB: 19-Jan-1971  01/13/2020  Ms. Kenealy was observed post Covid-19 immunization for 15 minutes without incident. She was provided with Vaccine Information Sheet and instruction to access the V-Safe system.   Ms. Busey was instructed to call 911 with any severe reactions post vaccine: Marland Kitchen Difficulty breathing  . Swelling of face and throat  . A fast heartbeat  . A bad rash all over body  . Dizziness and weakness   Immunizations Administered    Name Date Dose VIS Date Route   Pfizer COVID-19 Vaccine 01/13/2020  8:16 AM 0.3 mL 10/08/2018 Intramuscular   Manufacturer: [employer], [employer]   Lot: [id number]   NDC: [address]-8

## 2020-03-08 ENCOUNTER — Other Ambulatory Visit: Payer: Self-pay

## 2020-03-08 ENCOUNTER — Other Ambulatory Visit: Payer: Self-pay | Admitting: *Deleted

## 2020-03-08 DIAGNOSIS — Z124 Encounter for screening for malignant neoplasm of cervix: Secondary | ICD-10-CM

## 2020-03-08 NOTE — Progress Notes (Signed)
Patient: Kaylee White           Date of Birth: [birth year]           MRN: [id number] Visit Date: 03/08/2020 PCP: Patient, No Pcp Per  Cervical Cancer Screening Do you smoke?: Yes Have you ever had or been told you have an allergy to latex products?: No Marital status: Single Date of last pap smear: 1-2 yrs ago (03/2019-Negative) Date of last menstrual period:  (No Menses x 1 year. Menopause-hot flashes) Number of pregnancies: 3 Number of births: 2 Have you ever had any of the following? Hysterectomy: No Tubal ligation (tubes tied): Yes Abnormal bleeding: No (irregular menses-possibly related to menopause) Abnormal pap smear: No Venereal warts: No A sex partner with venereal warts: No A high risk* sex partner: No  Cervical Exam Pap smear completed: Pap test Abnormal Observations: White patchy discharge noted on cervix. Cervix is non-friable. Patient states she hasn't noticed any discharge but was once informed that she was prediabetic. Patient informed that diabetes can cause recurrent yeast infections. Recommendations: Return for Pap smear in 3 years.     Patient's History There are no problems to display for this patient.  No past medical history on file.  No family history on file.  Social History   Occupational History  . Not on file  Tobacco Use  . Smoking status: Current Some Day Smoker    Packs/day: 0.25    Types: Cigarettes  . Smokeless tobacco: Never Used  Substance and Sexual Activity  . Alcohol use: No  . Drug use: No  . Sexual activity: Not on file   Attestation of Supervision of Student:  I confirm that I have verified the information documented in the nurse practitioner student's note and that I have also personally reperformed the history, physical exam and all medical decision making activities.  I have verified that all services and findings are accurately documented in this student's note; and I agree with management and plan as outlined in the  documentation. I have also made any necessary editorial changes.  Brannock, Kathaleen Maser, RN [facility] [employer] 03/08/2020 10:55 AM

## 2020-03-10 LAB — CYTOLOGY - PAP
Adequacy: ABSENT
Diagnosis: NEGATIVE
Diagnosis: REACTIVE

## 2020-03-11 ENCOUNTER — Telehealth: Payer: Self-pay

## 2020-03-11 NOTE — Telephone Encounter (Signed)
Normal Pap results letter mailed to patient.  

## 2020-05-21 ENCOUNTER — Encounter: Payer: Self-pay | Admitting: Internal Medicine

## 2020-05-25 ENCOUNTER — Telehealth: Payer: Medicaid Other | Admitting: Internal Medicine

## 2020-05-25 DIAGNOSIS — Z7689 Persons encountering health services in other specified circumstances: Secondary | ICD-10-CM

## 2020-06-09 ENCOUNTER — Telehealth (INDEPENDENT_AMBULATORY_CARE_PROVIDER_SITE_OTHER): Payer: 59 | Admitting: Physician Assistant

## 2020-06-09 ENCOUNTER — Other Ambulatory Visit: Payer: Self-pay

## 2020-06-09 DIAGNOSIS — K2100 Gastro-esophageal reflux disease with esophagitis, without bleeding: Secondary | ICD-10-CM

## 2020-06-09 DIAGNOSIS — Z7689 Persons encountering health services in other specified circumstances: Secondary | ICD-10-CM

## 2020-06-09 DIAGNOSIS — B354 Tinea corporis: Secondary | ICD-10-CM | POA: Diagnosis not present

## 2020-06-09 MED ORDER — CLOTRIMAZOLE-BETAMETHASONE 1-0.05 % EX CREA: 1.0000 "application " | TOPICAL_CREAM | Freq: Two times a day (BID) | CUTANEOUS | 1 refills | Status: DC

## 2020-06-09 MED ORDER — OMEPRAZOLE 40 MG PO CPDR: 40.0000 mg | DELAYED_RELEASE_CAPSULE | Freq: Every day | ORAL | 3 refills | Status: DC

## 2020-06-09 NOTE — Progress Notes (Addendum)
Virtual Visit via Telephone Note  I connected with Demetrios Isaacs on 06/09/20 at  1:50 PM EDT by telephone and verified that I am speaking with the correct person using two identifiers.  Location: Patient: [facility] Provider: Georgian Co, PA-C   I discussed the limitations, risks, security and privacy concerns of performing an evaluation and management service by telephone and the availability of in person appointments. I also discussed with the patient that there may be a patient responsible charge related to this service. The patient expressed understanding and agreed to proceed.  PATIENT visit by telephone virtually in the context of Covid-19 pandemic. Patient location:  home My Location:  [facility] office Persons on the call:  Me, the patient, and roomed by Laurena Bering   History of Present Illness:  Patient needs PCP.  Need prescription for prilosec which she has been taking for a couple of months and it helps with her heartburn.   Also c/o ringworm rash that she's been trying to treat with hydrocortisone.  No fevers.      Observations/Objective:  NAD.  A&Ox3   Assessment and Plan:   1. Encounter to establish care Scheduled for CPE and labs  2. Gastroesophageal reflux disease with esophagitis without hemorrhage - omeprazole (PRILOSEC) 40 MG capsule; Take 1 capsule (40 mg total) by mouth daily.  Dispense: 30 capsule; Refill: 3  3. Tinea corporis Stop plain cortisone - clotrimazole-betamethasone (LOTRISONE) cream; Apply 1 application topically 2 (two) times daily. For 3 weeks  Dispense: 30 g; Refill: 1      Follow Up Instructions: CPE 11/11   I discussed the assessment and treatment plan with the patient. The patient was provided an opportunity to ask questions and all were answered. The patient agreed with the plan and demonstrated an understanding of the instructions.   The patient was advised to call back or seek an in-person evaluation if the symptoms  worsen or if the condition fails to improve as anticipated.  I provided 14 minutes of non-face-to-face time during this encounter.   Georgian Co, PA-C  Patient ID: Demetrios Isaacs, female   DOB: 24-Jun-1971, 49 y.o.   MRN: [id number]

## 2020-06-09 NOTE — Addendum Note (Signed)
Addended by: Creston Klas M on: 06/09/2020 03:41 PM   Modules accepted: Level of Service  

## 2020-06-23 NOTE — Patient Instructions (Signed)
Thank you for choosing [facility] at [facility] to be your medical home!    Kaylee White was seen by De Hollingshead, DO today.   Amalia Greenhouse Eddie's primary care provider is Marcy Siren, DO.   For the best care possible, you should try to see Marcy Siren, DO whenever you come to the clinic.   We look forward to seeing you again soon!  If you have any questions about your visit today, please call us at [phone] or feel free to reach your primary care provider via MyChart.   Keeping You Healthy  Get These Tests 1. Blood Pressure- Have your blood pressure checked once a year by your health care provider.  Normal blood pressure is 120/80. 2. Weight- Have your body mass index (BMI) calculated to screen for obesity.  BMI is measure of body fat based on height and weight.  You can also calculate your own BMI at https://www.west-esparza.com/. 3. Cholesterol- Have your cholesterol checked every 5 years starting at age 2 then yearly starting at age 66. 4. Chlamydia, HIV, and other sexually transmitted diseases- Get screened every year until age 47, then within three months of each new sexual provider. 5. Pap Test - Every 1-5 years; discuss with your health care provider. 6. Mammogram- Every 1-2 years starting at age 79--50  Take these medicines  Calcium with Vitamin D-Your body needs 1200 mg of Calcium each day and [phone] IU of Vitamin D daily.  Your body can only absorb 500 mg of Calcium at a time so Calcium must be taken in 2 or 3 divided doses throughout the day.  Multivitamin with folic acid- Once daily if it is possible for you to become pregnant.  Get these Immunizations  Gardasil-Series of three doses; prevents HPV related illness such as genital warts and cervical cancer.  Menactra-Single dose; prevents meningitis.  Tetanus shot- Every 10 years.  Flu shot-Every year.  Take these steps 1. Do not smoke-Your healthcare provider can help you quit.  For tips  on how to quit go to www.smokefree.gov or call 1-800 QUITNOW. 2. Be physically active- Exercise 5 days a week for at least 30 minutes.  If you are not already physically active, start slow and gradually work up to 30 minutes of moderate physical activity.  Examples of moderate activity include walking briskly, dancing, swimming, bicycling, etc. 3. Breast Cancer- A self breast exam every month is important for early detection of breast cancer.  For more information and instruction on self breast exams, ask your healthcare provider or [email or web address].[email or web address]. 4. Eat a healthy diet- Eat a variety of healthy foods such as fruits, vegetables, whole grains, low fat milk, low fat cheeses, yogurt, lean meats, poultry and fish, beans, nuts, tofu, etc.  For more information go to www. Thenutritionsource.org 5. Drink alcohol in moderation- Limit alcohol intake to one drink or less per day. Never drink and drive. 6. Depression- Your emotional health is as important as your physical health.  If you're feeling down or losing interest in things you normally enjoy please talk to your healthcare provider about being screened for depression. 7. Dental visit- Brush and floss your teeth twice daily; visit your dentist twice a year. 8. Eye doctor- Get an eye exam at least every 2 years. 9. Helmet use- Always wear a helmet when riding a bicycle, motorcycle, rollerblading or skateboarding. 10. Safe sex- If you may be exposed to sexually transmitted infections, use a condom. 11. Seat belts- Seat belts can save  your live; always wear one. 12. Smoke/Carbon Monoxide detectors- These detectors need to be installed on the appropriate level of your home. Replace batteries at least once a year. 13. Skin cancer- When out in the sun please cover up and use sunscreen 15 SPF or higher. 14. Violence- If anyone is threatening or hurting you, please tell your healthcare provider.

## 2020-06-24 ENCOUNTER — Ambulatory Visit (INDEPENDENT_AMBULATORY_CARE_PROVIDER_SITE_OTHER): Payer: 59 | Admitting: Internal Medicine

## 2020-06-24 ENCOUNTER — Other Ambulatory Visit (HOSPITAL_COMMUNITY)
Admission: RE | Admit: 2020-06-24 | Discharge: 2020-06-24 | Disposition: A | Discharge status: Home / Self Care | Payer: 59 | Source: Ambulatory Visit | Attending: Internal Medicine | Admitting: Internal Medicine

## 2020-06-24 ENCOUNTER — Encounter: Payer: Self-pay | Admitting: Internal Medicine

## 2020-06-24 ENCOUNTER — Other Ambulatory Visit: Payer: Self-pay

## 2020-06-24 VITALS — BP 141/88 | HR 87 | Temp 97.2°F | Resp 16 | Ht 61.0 in | Wt 145.0 lb

## 2020-06-24 DIAGNOSIS — G8929 Other chronic pain: Secondary | ICD-10-CM

## 2020-06-24 DIAGNOSIS — Z113 Encounter for screening for infections with a predominantly sexual mode of transmission: Secondary | ICD-10-CM

## 2020-06-24 DIAGNOSIS — Z23 Encounter for immunization: Secondary | ICD-10-CM | POA: Diagnosis not present

## 2020-06-24 DIAGNOSIS — Z130 Encounter for screening for diseases of the blood and blood-forming organs and certain disorders involving the immune mechanism: Secondary | ICD-10-CM | POA: Diagnosis not present

## 2020-06-24 DIAGNOSIS — Z0000 Encounter for general adult medical examination without abnormal findings: Secondary | ICD-10-CM | POA: Diagnosis not present

## 2020-06-24 DIAGNOSIS — Z13220 Encounter for screening for lipoid disorders: Secondary | ICD-10-CM | POA: Diagnosis not present

## 2020-06-24 DIAGNOSIS — Z13228 Encounter for screening for other metabolic disorders: Secondary | ICD-10-CM | POA: Diagnosis not present

## 2020-06-24 DIAGNOSIS — Z1159 Encounter for screening for other viral diseases: Secondary | ICD-10-CM

## 2020-06-24 DIAGNOSIS — Z131 Encounter for screening for diabetes mellitus: Secondary | ICD-10-CM

## 2020-06-24 DIAGNOSIS — R399 Unspecified symptoms and signs involving the genitourinary system: Secondary | ICD-10-CM

## 2020-06-24 DIAGNOSIS — Z1231 Encounter for screening mammogram for malignant neoplasm of breast: Secondary | ICD-10-CM

## 2020-06-24 DIAGNOSIS — M545 Low back pain, unspecified: Secondary | ICD-10-CM

## 2020-06-24 MED ORDER — DICLOFENAC SODIUM 50 MG PO TBEC: 50.0000 mg | DELAYED_RELEASE_TABLET | Freq: Two times a day (BID) | ORAL | 1 refills | Status: DC

## 2020-06-24 MED ORDER — CYCLOBENZAPRINE HCL 10 MG PO TABS: 10.0000 mg | ORAL_TABLET | Freq: Three times a day (TID) | ORAL | 1 refills | Status: DC | PRN

## 2020-06-24 NOTE — Progress Notes (Signed)
Subjective:    Kaylee White - 49 y.o. female MRN [id number]  Date of birth: 01/03/1971  HPI  Kaylee White is here for annual exam. Has concerns about low back pain, bilateral. 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 Does not radiate down legs. No changes in bowel/bladder function. No numbness/tingling.   Also asks for STD testing today.      Health Maintenance: Health Maintenance Due  Topic Date Due  . Hepatitis C Screening  Never done  . TETANUS/TDAP  Never done  . INFLUENZA VACCINE  Never done    -  reports that she has been smoking cigarettes. She has been smoking about 0.25 packs per day. She has never used smokeless tobacco. - Review of Systems: Per HPI. - Past Medical History: There are no problems to display for this patient.  - Medications: reviewed and updated   Objective:   Physical Exam BP (!) 141/88   Pulse 87   Temp (!) 97.2 F (36.2 C) (Temporal)   Resp 16   Ht 5\' 1"  (1.549 m)   Wt 145 lb (65.8 kg)   SpO2 98%   BMI 27.40 kg/m  Physical Exam Constitutional:      Appearance: She is not diaphoretic.  HENT:     Head: Normocephalic and atraumatic.  Eyes:     Conjunctiva/sclera: Conjunctivae normal.     Pupils: Pupils are equal, round, and reactive to light.  Neck:     Thyroid: No thyromegaly.  Cardiovascular:     Rate and Rhythm: Normal rate and regular rhythm.     Heart sounds: Normal heart sounds. No murmur heard.   Pulmonary:     Effort: Pulmonary effort is normal. No respiratory distress.     Breath sounds: Normal breath sounds. No wheezing.  Abdominal:     General: Bowel sounds are normal. There is no distension.     Palpations: Abdomen is soft.     Tenderness: There is no abdominal tenderness. There is no guarding or rebound.  Musculoskeletal:        General: No deformity. Normal range of motion.     Cervical back: Normal range of motion and neck supple.     Comments: Full ROM at lower back. No TTP  over the spine. Bilateral paraspinal muscles feel ropey and tight.   Lymphadenopathy:     Cervical: No cervical adenopathy.  Skin:    General: Skin is warm and dry.     Findings: No rash.  Neurological:     Mental Status: She is alert and oriented to person, place, and time.     Gait: Gait is intact.  Psychiatric:        Mood and Affect: Mood and affect normal.        Judgment: Judgment normal.            Assessment & Plan:   1. Annual physical exam Counseled on 150 minutes of exercise per week, healthy eating (including decreased daily intake of saturated fats, cholesterol, added sugars, sodium), STI prevention, routine healthcare maintenance. - CBC with Differential - Comprehensive metabolic panel - Lipid Panel  2. Screening for deficiency anemia - CBC with Differential  3. Screening for metabolic disorder - Comprehensive metabolic panel  4. Lipid screening - Lipid Panel  5. Need for hepatitis C screening test - HCV Ab w/Rflx to Verification  6. Breast cancer screening by mammogram - MM Digital Screening; Future  7. Screening for STDs (sexually  transmitted diseases) - HIV antibody (with reflex) - RPR - Cervicovaginal ancillary only  8. Diabetes mellitus screening - Hemoglobin A1c  9. Needs flu shot - Flu Vaccine QUAD 6+ mos PF IM (Fluarix Quad PF)  10. Need for Tdap vaccination - Tdap vaccine greater than or equal to 7yo IM  11. Chronic bilateral low back pain without sciatica Suspect related to muscle strain from inappropriate lifting technique. No red flag symptoms. No exam findings or symptoms that would warrant imaging at present. Will treat with muscle relaxer and NSAID. Discussed other supportive care measures.  - cyclobenzaprine (FLEXERIL) 10 MG tablet; Take 1 tablet (10 mg total) by mouth 3 (three) times daily as needed for muscle spasms.  Dispense: 30 tablet; Refill: 1 - diclofenac (VOLTAREN) 50 MG EC tablet; Take 1 tablet (50 mg total) by mouth 2  (two) times daily.  Dispense: 60 tablet; Refill: 1    Marcy Siren, D.O. 06/24/2020, 10:09 AM Primary Care at [facility]

## 2020-06-25 ENCOUNTER — Other Ambulatory Visit: Payer: Self-pay | Admitting: Internal Medicine

## 2020-06-25 ENCOUNTER — Telehealth: Payer: Self-pay | Admitting: Internal Medicine

## 2020-06-25 DIAGNOSIS — N3000 Acute cystitis without hematuria: Secondary | ICD-10-CM

## 2020-06-25 LAB — HCV INTERPRETATION

## 2020-06-25 LAB — LIPID PANEL
Chol/HDL Ratio: 4.8 ratio — ABNORMAL HIGH (ref 0.0–4.4)
Cholesterol, Total: 304 mg/dL — ABNORMAL HIGH (ref 100–199)
HDL: 63 mg/dL (ref >39)
LDL Chol Calc (NIH): 190 mg/dL — ABNORMAL HIGH (ref 0–99)
Triglycerides: 265 mg/dL — ABNORMAL HIGH (ref 0–149)
VLDL Cholesterol Cal: 51 mg/dL — ABNORMAL HIGH (ref 5–40)

## 2020-06-25 LAB — CBC WITH DIFFERENTIAL/PLATELET
Basophils Absolute: 0.1 10*3/uL (ref 0.0–0.2)
Basos: 1 %
EOS (ABSOLUTE): 0.2 10*3/uL (ref 0.0–0.4)
Eos: 2 %
Hematocrit: 42.6 % (ref 34.0–46.6)
Hemoglobin: 13.9 g/dL (ref 11.1–15.9)
Immature Grans (Abs): 0 10*3/uL (ref 0.0–0.1)
Immature Granulocytes: 0 %
Lymphocytes Absolute: 3 10*3/uL (ref 0.7–3.1)
Lymphs: 31 %
MCH: 30.3 pg (ref 26.6–33.0)
MCHC: 32.6 g/dL (ref 31.5–35.7)
MCV: 93 fL (ref 79–97)
Monocytes Absolute: 0.8 10*3/uL (ref 0.1–0.9)
Monocytes: 9 %
Neutrophils Absolute: 5.4 10*3/uL (ref 1.4–7.0)
Neutrophils: 57 %
Platelets: 428 10*3/uL (ref 150–450)
RBC: 4.59 x10E6/uL (ref 3.77–5.28)
RDW: 13.1 % (ref 11.7–15.4)
WBC: 9.5 10*3/uL (ref 3.4–10.8)

## 2020-06-25 LAB — CERVICOVAGINAL ANCILLARY ONLY
Bacterial Vaginitis (gardnerella): POSITIVE — AB
Candida Glabrata: NEGATIVE
Candida Vaginitis: NEGATIVE
Chlamydia: NEGATIVE
Comment: NEGATIVE
Comment: NEGATIVE
Comment: NEGATIVE
Comment: NEGATIVE
Comment: NEGATIVE
Comment: NORMAL
Neisseria Gonorrhea: NEGATIVE
Trichomonas: NEGATIVE

## 2020-06-25 LAB — URINALYSIS, COMPLETE
Bilirubin, UA: NEGATIVE
Glucose, UA: NEGATIVE
Ketones, UA: NEGATIVE
Nitrite, UA: POSITIVE — AB
Protein,UA: NEGATIVE
Specific Gravity, UA: 1.018 (ref 1.005–1.030)
Urobilinogen, Ur: 0.2 mg/dL (ref 0.2–1.0)
pH, UA: 5.5 (ref 5.0–7.5)

## 2020-06-25 LAB — COMPREHENSIVE METABOLIC PANEL
ALT: 28 IU/L (ref 0–32)
AST: 18 IU/L (ref 0–40)
Albumin/Globulin Ratio: 1.4 (ref 1.2–2.2)
Albumin: 4.8 g/dL (ref 3.8–4.8)
Alkaline Phosphatase: 104 IU/L (ref 44–121)
BUN/Creatinine Ratio: 18 (ref 9–23)
BUN: 14 mg/dL (ref 6–24)
Bilirubin Total: 0.3 mg/dL (ref 0.0–1.2)
CO2: 23 mmol/L (ref 20–29)
Calcium: 9.8 mg/dL (ref 8.7–10.2)
Chloride: 103 mmol/L (ref 96–106)
Creatinine, Ser: 0.77 mg/dL (ref 0.57–1.00)
GFR calc Af Amer: 105 mL/min/{1.73_m2} (ref >59)
GFR calc non Af Amer: 91 mL/min/{1.73_m2} (ref >59)
Globulin, Total: 3.4 g/dL (ref 1.5–4.5)
Glucose: 88 mg/dL (ref 65–99)
Potassium: 4.8 mmol/L (ref 3.5–5.2)
Sodium: 142 mmol/L (ref 134–144)
Total Protein: 8.2 g/dL (ref 6.0–8.5)

## 2020-06-25 LAB — HEMOGLOBIN A1C
Est. average glucose Bld gHb Est-mCnc: 128 mg/dL
Hgb A1c MFr Bld: 6.1 % — ABNORMAL HIGH (ref 4.8–5.6)

## 2020-06-25 LAB — MICROSCOPIC EXAMINATION: Casts: NONE SEEN /lpf

## 2020-06-25 LAB — RPR: RPR Ser Ql: NONREACTIVE

## 2020-06-25 LAB — HIV ANTIBODY (ROUTINE TESTING W REFLEX): HIV Screen 4th Generation wRfx: NONREACTIVE

## 2020-06-25 LAB — HCV AB W/RFLX TO VERIFICATION: HCV Ab: 0.1 s/co ratio (ref 0.0–0.9)

## 2020-06-25 MED ORDER — CEPHALEXIN 500 MG PO CAPS: 500.0000 mg | ORAL_CAPSULE | Freq: Two times a day (BID) | ORAL | 0 refills | Status: DC

## 2020-06-25 NOTE — Telephone Encounter (Signed)
Pt returning call for lab results  

## 2020-06-25 NOTE — Progress Notes (Signed)
Patient notified of results & recommendations. Expressed understanding.

## 2020-06-26 ENCOUNTER — Other Ambulatory Visit: Payer: Self-pay | Admitting: Family Medicine

## 2020-06-26 DIAGNOSIS — B9689 Other specified bacterial agents as the cause of diseases classified elsewhere: Secondary | ICD-10-CM

## 2020-06-26 DIAGNOSIS — N760 Acute vaginitis: Secondary | ICD-10-CM

## 2020-06-26 MED ORDER — METRONIDAZOLE 500 MG PO TABS: 500.0000 mg | ORAL_TABLET | Freq: Two times a day (BID) | ORAL | 0 refills | Status: AC

## 2020-06-30 LAB — URINE CULTURE

## 2020-06-30 LAB — SPECIMEN STATUS REPORT

## 2020-07-15 ENCOUNTER — Other Ambulatory Visit: Payer: Self-pay

## 2020-07-15 ENCOUNTER — Emergency Department (HOSPITAL_COMMUNITY): Payer: 59

## 2020-07-15 ENCOUNTER — Encounter (HOSPITAL_COMMUNITY): Payer: Self-pay | Admitting: Emergency Medicine

## 2020-07-15 ENCOUNTER — Emergency Department (HOSPITAL_COMMUNITY)
Admission: EM | Admit: 2020-07-15 | Discharge: 2020-07-15 | Disposition: A | Discharge status: Home / Self Care | Payer: 59 | Attending: Emergency Medicine | Admitting: Emergency Medicine

## 2020-07-15 DIAGNOSIS — S6991XA Unspecified injury of right wrist, hand and finger(s), initial encounter: Secondary | ICD-10-CM | POA: Diagnosis present

## 2020-07-15 DIAGNOSIS — R531 Weakness: Secondary | ICD-10-CM | POA: Diagnosis not present

## 2020-07-15 DIAGNOSIS — M20011 Mallet finger of right finger(s): Secondary | ICD-10-CM | POA: Insufficient documentation

## 2020-07-15 DIAGNOSIS — S61214A Laceration without foreign body of right ring finger without damage to nail, initial encounter: Secondary | ICD-10-CM | POA: Insufficient documentation

## 2020-07-15 DIAGNOSIS — F17210 Nicotine dependence, cigarettes, uncomplicated: Secondary | ICD-10-CM | POA: Insufficient documentation

## 2020-07-15 DIAGNOSIS — S61216A Laceration without foreign body of right little finger without damage to nail, initial encounter: Secondary | ICD-10-CM

## 2020-07-15 DIAGNOSIS — W230XXA Caught, crushed, jammed, or pinched between moving objects, initial encounter: Secondary | ICD-10-CM | POA: Insufficient documentation

## 2020-07-15 MED ORDER — CEPHALEXIN 500 MG PO CAPS: 500.0000 mg | ORAL_CAPSULE | Freq: Three times a day (TID) | ORAL | 0 refills | Status: AC

## 2020-07-15 MED ORDER — LIDOCAINE HCL (PF) 1 % IJ SOLN
5.0000 mL | Freq: Once | INTRAMUSCULAR | Status: AC
  Administered 2020-07-15: 5 mL
  Filled 2020-07-15: qty 5

## 2020-07-15 NOTE — ED Triage Notes (Signed)
Patient arrives to ED via [facility] to [facility] with c/o injury to right ringer finger. Pt slammed finger in car door this morning. Sensation and movement intact. Laceration, with controlled bleeding.

## 2020-07-15 NOTE — ED Provider Notes (Signed)
[facility] EMERGENCY DEPARTMENT Provider Note   CSN: [id number] Arrival date & time: 07/15/20  2774     History Chief Complaint  Patient presents with   Finger Injury    Kaylee White is a 49 y.o. female.  49 year old female presents with right fourth finger injury.  Patient states that she closed her finger in a car door today resulting in a laceration to the distal finger and she is unable to extend the distal fourth finger.  Sensation intact.  Patient is right-hand dominant.  No other injuries or concerns.  Tetanus is up-to-date.        Past Medical History:  Diagnosis Date   Alcohol abuse    MDD (major depressive disorder)    Substance abuse (HCC)    Vitamin D deficiency     There are no problems to display for this patient.   Past Surgical History:  Procedure Laterality Date   CHOLECYSTECTOMY     MANDIBLE RECONSTRUCTION     MULTIPLE TOOTH EXTRACTIONS       OB History   No obstetric history on file.     Family History  Problem Relation Age of Onset   COPD Mother    Asthma Father    Eczema Father    Epilepsy Maternal Aunt     Social History   Tobacco Use   Smoking status: Current Some Day Smoker    Packs/day: 0.25    Types: Cigarettes   Smokeless tobacco: Never Used  Substance Use Topics   Alcohol use: Yes   Drug use: Yes    Types: Cocaine, Marijuana    Home Medications Prior to Admission medications   Medication Sig Start Date End Date Taking? Authorizing Provider  cephALEXin (KEFLEX) 500 MG capsule Take 1 capsule (500 mg total) by mouth 3 (three) times daily for 5 days. 07/15/20 07/20/20  Jeannie Fend, PA-C  clotrimazole-betamethasone (LOTRISONE) cream Apply 1 application topically 2 (two) times daily. For 3 weeks 06/09/20   Anders Simmonds, PA-C  cyclobenzaprine (FLEXERIL) 10 MG tablet Take 1 tablet (10 mg total) by mouth 3 (three) times daily as needed for muscle spasms. 06/24/20   Arvilla Market, DO  diclofenac (VOLTAREN) 50 MG EC tablet Take 1 tablet (50 mg total) by mouth 2 (two) times daily. 06/24/20   Arvilla Market, DO  omeprazole (PRILOSEC) 40 MG capsule Take 1 capsule (40 mg total) by mouth daily. 06/09/20   Anders Simmonds, PA-C    Allergies    Aspirin and Tylenol [acetaminophen]  Review of Systems   Review of Systems  Constitutional: Negative for fever.  Musculoskeletal: Positive for arthralgias.  Skin: Positive for wound.  Allergic/Immunologic: Negative for immunocompromised state.  Neurological: Positive for weakness. Negative for numbness.    Physical Exam Updated Vital Signs BP (!) 138/95 (BP Location: Right Arm)    Pulse 98    Temp 98 F (36.7 C) (Oral)    Resp 15    Ht 5\' 1"  (1.549 m)    Wt 65.8 kg    SpO2 99%    BMI 27.40 kg/m   Physical Exam Vitals and nursing note reviewed.  Constitutional:      General: She is not in acute distress.    Appearance: She is well-developed. She is not diaphoretic.  HENT:     Head: Normocephalic and atraumatic.  Cardiovascular:     Pulses: Normal pulses.  Pulmonary:     Effort: Pulmonary effort is normal.  Musculoskeletal:        General: Tenderness present.     Comments: Laceration to dorsal aspect of right fourth finger at the DIP, not involving the nail bed.  Patient is unable to extend the distal phalanx.  Sensation intact.  Bleeding controlled.  Skin:    General: Skin is warm and dry.     Capillary Refill: Capillary refill takes less than 2 seconds.  Neurological:     Mental Status: She is alert and oriented to person, place, and time.     Sensory: No sensory deficit.     Motor: Weakness present.  Psychiatric:        Behavior: Behavior normal.     ED Results / Procedures / Treatments   Labs (all labs ordered are listed, but only abnormal results are displayed) Labs Reviewed - No data to display  EKG None  Radiology DG Hand Complete Right  Result Date:  07/15/2020 CLINICAL DATA:  Slammed right 4th finger in truck door.laceration,pain pip joint right ring finger,unable to straighten distal finger EXAM: RIGHT HAND - COMPLETE 3+ VIEW COMPARISON:  None. FINDINGS: No fracture.  Joints are normally aligned. DIP joint of the fourth finger is flexed. Mild asymmetric joint space narrowing of the IP joint of the thumb with small marginal osteophytes consistent with osteoarthritis. No periarticular erosions. Soft tissues are unremarkable. IMPRESSION: 1. No fracture or dislocation. 2. Right fourth finger DIP joint flexion consistent with ligamentous or tendon injury. Electronically Signed   By: Amie Portland M.D.   On: 07/15/2020 09:36    Procedures .Marland KitchenLaceration Repair  Date/Time: 07/15/2020 2:11 PM Performed by: Jeannie Fend, PA-C Authorized by: Jeannie Fend, PA-C   Consent:    Consent obtained:  Verbal   Consent given by:  Patient   Risks discussed:  Infection, need for additional repair, pain, poor cosmetic result and poor wound healing   Alternatives discussed:  No treatment and delayed treatment Universal protocol:    Procedure explained and questions answered to patient or proxy's satisfaction: yes     Relevant documents present and verified: yes     Test results available and properly labeled: yes     Imaging studies available: yes     Required blood products, implants, devices, and special equipment available: yes     Site/side marked: yes     Immediately prior to procedure, a time out was called: yes     Patient identity confirmed:  Verbally with patient Anesthesia (see MAR for exact dosages):    Anesthesia method:  Nerve block   Block location:  4th MCP digital block   Block anesthetic:  Lidocaine 1% w/o epi   Block injection procedure:  Anatomic landmarks identified, anatomic landmarks palpated, introduced needle and incremental injection   Block outcome:  Anesthesia achieved Laceration details:    Location:  Finger   Finger  location:  R ring finger   Length (cm):  2.5   Depth (mm):  2 Repair type:    Repair type:  Simple Pre-procedure details:    Preparation:  Patient was prepped and draped in usual sterile fashion and imaging obtained to evaluate for foreign bodies Exploration:    Hemostasis achieved with:  Direct pressure   Wound exploration: wound explored through full range of motion and entire depth of wound probed and visualized     Wound extent: tendon damage     Wound extent: no foreign bodies/material noted and no underlying fracture noted     Tendon damage  location:  Upper extremity   Upper extremity tendon damage location:  Finger extensor   Tendon damage extent:  Complete transection   Tendon repair plan:  Refer for evaluation   Contaminated: no   Treatment:    Area cleansed with:  Saline   Amount of cleaning:  Extensive   Irrigation solution:  Sterile saline Skin repair:    Repair method:  Sutures   Suture size:  4-0   Suture material:  Nylon   Suture technique:  Simple interrupted   Number of sutures:  5 Approximation:    Approximation:  Close Post-procedure details:    Dressing:  Splint for protection   Patient tolerance of procedure:  Tolerated well, no immediate complications   (including critical care time)  Medications Ordered in ED Medications  lidocaine (PF) (XYLOCAINE) 1 % injection 5 mL (5 mLs Infiltration Given 07/15/20 1120)    ED Course  I have reviewed the triage vital signs and the nursing notes.  Pertinent labs & imaging results that were available during my care of the patient were reviewed by me and considered in my medical decision making (see chart for details).  Clinical Course as of Jul 15 1413  Thu Jul 15, 2020  5232 49 year old right-hand-dominant female with laceration to the right fourth finger, unable to extend distal phalanx.  X-ray is negative for fracture.  Concern for extensor tendon injury, consult to Earney Hamburg, PA-C with Ortho who will  discuss with hand surgery and call back.   [LM]  1155 Plan is to close wound in the ER and follow up with Dr. Merlyn Lot in 1 week, patient understands to call the office today and schedule this appointment. Discussed likely tendon injury resulting in mallet finger today, finger splinted in extension. Advised patient to keep finger in extension and demonstrated dressing changes. Patient understands splinting finger may not be enough for tendon to heal and may ultimately end up needing surgery.    [LM]    Clinical Course User Index [LM] Alden Hipp   MDM Rules/Calculators/A&P                          Final Clinical Impression(s) / ED Diagnoses Final diagnoses:  Laceration of right little finger without foreign body without damage to nail, initial encounter  Mallet finger of right hand    Rx / DC Orders ED Discharge Orders         Ordered    cephALEXin (KEFLEX) 500 MG capsule  3 times daily        07/15/20 1149           Alden Hipp 07/15/20 1414    Alvira Monday, MD 07/16/20 [phone]

## 2020-07-15 NOTE — Discharge Instructions (Addendum)
Keep wound clean and dry. Clean with mild soap (Dove) on a q-tip as needed. Keep finger fully extended at all times. Dressing changes as discussed with hand on a flat surface to keep the tip of your finger extended.  Follow up with Dr. Merlyn Lot with hand ortho, call today to schedule an appointment in 1 week.  Take Keflex as prescribed to prevent infection. Take Motrin as needed as directed for pain.

## 2020-07-15 NOTE — ED Notes (Signed)
Gave pt [place] sandwich, crackers and gingerale.

## 2020-07-19 NOTE — Progress Notes (Signed)
Patient notified of results & recommendations. Expressed understanding.

## 2020-07-28 ENCOUNTER — Other Ambulatory Visit: Payer: Self-pay | Admitting: Orthopedic Surgery

## 2020-07-29 ENCOUNTER — Other Ambulatory Visit (HOSPITAL_COMMUNITY): Payer: 59

## 2020-07-29 ENCOUNTER — Encounter (HOSPITAL_BASED_OUTPATIENT_CLINIC_OR_DEPARTMENT_OTHER): Payer: Self-pay | Admitting: Orthopedic Surgery

## 2020-07-29 ENCOUNTER — Other Ambulatory Visit: Payer: Self-pay

## 2020-08-17 ENCOUNTER — Other Ambulatory Visit: Payer: Self-pay | Admitting: Physician Assistant

## 2020-08-17 DIAGNOSIS — K2100 Gastro-esophageal reflux disease with esophagitis, without bleeding: Secondary | ICD-10-CM

## 2020-08-18 ENCOUNTER — Encounter (HOSPITAL_BASED_OUTPATIENT_CLINIC_OR_DEPARTMENT_OTHER): Payer: Self-pay | Admitting: Orthopedic Surgery

## 2020-08-19 ENCOUNTER — Other Ambulatory Visit (HOSPITAL_COMMUNITY): Payer: Medicaid Other

## 2020-08-20 ENCOUNTER — Other Ambulatory Visit: Payer: Self-pay | Admitting: Internal Medicine

## 2020-08-20 ENCOUNTER — Other Ambulatory Visit (HOSPITAL_COMMUNITY)
Admission: RE | Admit: 2020-08-20 | Discharge: 2020-08-20 | Disposition: A | Discharge status: Home / Self Care | Payer: Medicaid Other | Source: Ambulatory Visit | Attending: Orthopedic Surgery | Admitting: Orthopedic Surgery

## 2020-08-20 ENCOUNTER — Encounter (HOSPITAL_BASED_OUTPATIENT_CLINIC_OR_DEPARTMENT_OTHER)
Admission: RE | Admit: 2020-08-20 | Discharge: 2020-08-20 | Disposition: A | Discharge status: Home / Self Care | Payer: Medicaid Other | Source: Ambulatory Visit | Attending: Orthopedic Surgery | Admitting: Orthopedic Surgery

## 2020-08-20 DIAGNOSIS — Z01812 Encounter for preprocedural laboratory examination: Secondary | ICD-10-CM | POA: Insufficient documentation

## 2020-08-20 DIAGNOSIS — Z20822 Contact with and (suspected) exposure to covid-19: Secondary | ICD-10-CM | POA: Diagnosis not present

## 2020-08-20 DIAGNOSIS — G8929 Other chronic pain: Secondary | ICD-10-CM

## 2020-08-20 LAB — COMPREHENSIVE METABOLIC PANEL
ALT: 39 U/L (ref 0–44)
AST: 29 U/L (ref 15–41)
Albumin: 3.9 g/dL (ref 3.5–5.0)
Alkaline Phosphatase: 91 U/L (ref 38–126)
Anion gap: 10 (ref 5–15)
BUN: 9 mg/dL (ref 6–20)
CO2: 23 mmol/L (ref 22–32)
Calcium: 9.6 mg/dL (ref 8.9–10.3)
Chloride: 108 mmol/L (ref 98–111)
Creatinine, Ser: 0.78 mg/dL (ref 0.44–1.00)
GFR, Estimated: >60 mL/min (ref >60)
Glucose, Bld: 79 mg/dL (ref 70–99)
Potassium: 4.4 mmol/L (ref 3.5–5.1)
Sodium: 141 mmol/L (ref 135–145)
Total Bilirubin: 0.5 mg/dL (ref 0.3–1.2)
Total Protein: 7.8 g/dL (ref 6.5–8.1)

## 2020-08-20 LAB — SARS CORONAVIRUS 2 (TAT 6-24 HRS): SARS Coronavirus 2: NEGATIVE

## 2020-08-20 NOTE — Progress Notes (Signed)

## 2020-08-24 ENCOUNTER — Other Ambulatory Visit: Payer: Self-pay

## 2020-08-27 ENCOUNTER — Other Ambulatory Visit (HOSPITAL_COMMUNITY)
Admission: RE | Admit: 2020-08-27 | Discharge: 2020-08-27 | Disposition: A | Discharge status: Home / Self Care | Payer: Medicaid Other | Source: Ambulatory Visit | Attending: Orthopedic Surgery | Admitting: Orthopedic Surgery

## 2020-08-27 DIAGNOSIS — Z01812 Encounter for preprocedural laboratory examination: Secondary | ICD-10-CM | POA: Diagnosis not present

## 2020-08-27 DIAGNOSIS — U071 COVID-19: Secondary | ICD-10-CM | POA: Insufficient documentation

## 2020-08-27 DIAGNOSIS — Z8616 Personal history of COVID-19: Secondary | ICD-10-CM

## 2020-08-27 HISTORY — DX: Personal history of COVID-19: Z86.16

## 2020-08-27 LAB — SARS CORONAVIRUS 2 (TAT 6-24 HRS): SARS Coronavirus 2: POSITIVE — AB

## 2020-08-28 NOTE — Progress Notes (Signed)
Lindwood Coke from the on-call service for Dr. Karlyn Agee with + covid results for this pt. The pt is to have surgery on Tues 08/31/20 at [facility] at 1:45pm.   These are the current guidelines:  Positive Results for:  Asymptomatic: Procedure postponed and quarantined for 10 days. Symptomatic: Procedure postponed and quarantined for 14 days. Hospitalized with Covid: postponed and quarantined  for 21 days. Immunocompromised: Procedure postponed and quarantine for 20 days.  The pt will not be retested for 90 days from the + result.

## 2020-08-31 ENCOUNTER — Telehealth (INDEPENDENT_AMBULATORY_CARE_PROVIDER_SITE_OTHER): Payer: Self-pay | Admitting: Internal Medicine

## 2020-08-31 ENCOUNTER — Other Ambulatory Visit: Payer: Self-pay

## 2020-08-31 ENCOUNTER — Encounter: Payer: Self-pay | Admitting: Internal Medicine

## 2020-08-31 DIAGNOSIS — U071 COVID-19: Secondary | ICD-10-CM

## 2020-08-31 DIAGNOSIS — R059 Cough, unspecified: Secondary | ICD-10-CM

## 2020-08-31 NOTE — Progress Notes (Signed)
Pt tested positive for COVID on 08/28/20, taking night time cold/cough medication, no further details on what is needed for this visit, denies need for work note

## 2020-08-31 NOTE — Progress Notes (Signed)
Virtual Visit via Telephone Note  I connected with Demetrios Isaacs, on 08/31/2020 at 1:49 PM by telephone due to the COVID-19 pandemic and verified that I am speaking with the correct person using two identifiers.   Consent: I discussed the limitations, risks, security and privacy concerns of performing an evaluation and management service by telephone and the availability of in person appointments. I also discussed with the patient that there may be a patient responsible charge related to this service. The patient expressed understanding and agreed to proceed.   Location of Patient: Home   Location of Provider: Clinic    Persons participating in Telemedicine visit: Adaley L Yolandra Habig [facility] Dr. Earlene Plater      History of Present Illness: Patient has a visit to discuss that she tested positive for COVID after tested 1/14. She was asymptomatic but was testing because she has to have surgery on a broken finger. Her son lives with her---she has told him to be tested but he has not gone yet. They are staying in separate areas of the house. Since testing has developed a cough. Afebrile. No other symptoms. She is taking Benadryl and Nighttime Cold/Cough medication.    Past Medical History:  Diagnosis Date  . Alcohol abuse   . GERD (gastroesophageal reflux disease)   . MDD (major depressive disorder)   . Substance abuse (HCC)   . Vitamin D deficiency    Allergies  Allergen Reactions  . Aspirin Itching and Nausea And Vomiting  . Tylenol [Acetaminophen] Itching and Nausea And Vomiting    Current Outpatient Medications on File Prior to Visit  Medication Sig Dispense Refill  . clotrimazole-betamethasone (LOTRISONE) cream Apply 1 application topically 2 (two) times daily. For 3 weeks (Patient not taking: Reported on 08/31/2020) 30 g 1  . cyclobenzaprine (FLEXERIL) 10 MG tablet Take 1 tablet (10 mg total) by mouth 3 (three) times daily as needed for muscle spasms. (Patient not taking:  Reported on 08/31/2020) 30 tablet 1  . diclofenac (VOLTAREN) 50 MG EC tablet TAKE 1 TABLET BY MOUTH TWICE A DAY (Patient not taking: Reported on 08/31/2020) 60 tablet 0  . omeprazole (PRILOSEC) 40 MG capsule TAKE 1 CAPSULE BY MOUTH EVERY DAY (Patient not taking: Reported on 08/31/2020) 30 capsule 0   No current facility-administered medications on file prior to visit.    Observations/Objective: NAD. Speaking clearly.  Work of breathing normal.  Alert and oriented. Mood appropriate.   Assessment and Plan: 1. COVID-19 virus detected Very mild symptoms. Have discussed appropriate quarantine period and then continue with the 3W's. Encouraged booster vaccine when appropriate--wait 10 days after positive test. Discussed reasons to follow up and reasons to seek emergency care.   2. Cough Very mild. Continue OTC cough suppressants.    Follow Up Instructions: PRN and for routine medical care    I discussed the assessment and treatment plan with the patient. The patient was provided an opportunity to ask questions and all were answered. The patient agreed with the plan and demonstrated an understanding of the instructions.   The patient was advised to call back or seek an in-person evaluation if the symptoms worsen or if the condition fails to improve as anticipated.     I provided 7 minutes total of non-face-to-face time during this encounter including median intraservice time, reviewing previous notes, investigations, ordering medications, medical decision making, coordinating care and patient verbalized understanding at the end of the visit.    Marcy Siren, D.O. Primary Care at [facility]  08/31/2020, 1:49 PM

## 2020-09-01 ENCOUNTER — Telehealth: Payer: Self-pay

## 2020-09-01 NOTE — Telephone Encounter (Signed)
Pt called to inquire about results from Nov. 2021, pt made aware of results and verbalized understanding, all questions answered

## 2020-09-10 ENCOUNTER — Encounter (HOSPITAL_BASED_OUTPATIENT_CLINIC_OR_DEPARTMENT_OTHER): Payer: Self-pay | Admitting: Orthopedic Surgery

## 2020-09-10 ENCOUNTER — Other Ambulatory Visit: Payer: Self-pay

## 2020-09-17 ENCOUNTER — Ambulatory Visit (HOSPITAL_BASED_OUTPATIENT_CLINIC_OR_DEPARTMENT_OTHER): Admission: RE | Admit: 2020-09-17 | Payer: 59 | Source: Home / Self Care | Admitting: Orthopedic Surgery

## 2020-09-17 ENCOUNTER — Encounter (HOSPITAL_COMMUNITY): Payer: Self-pay | Admitting: Anesthesiology

## 2020-09-17 HISTORY — DX: Gastro-esophageal reflux disease without esophagitis: K21.9

## 2020-09-17 SURGERY — WOUND EXPLORATION
Anesthesia: Choice | Laterality: Right

## 2020-09-17 NOTE — Anesthesia Preprocedure Evaluation (Deleted)
Anesthesia Evaluation    Reviewed: Allergy & Precautions, Patient's Chart, lab work & pertinent test results, Unable to perform ROS - Chart review only  Airway        Dental   Pulmonary Current Smoker,           Cardiovascular negative cardio ROS       Neuro/Psych PSYCHIATRIC DISORDERS Depression negative neurological ROS     GI/Hepatic Neg liver ROS, GERD  ,  Endo/Other  negative endocrine ROS  Renal/GU negative Renal ROS     Musculoskeletal negative musculoskeletal ROS (+)   Abdominal (+) + obese,   Peds  Hematology negative hematology ROS (+)   Anesthesia Other Findings   Reproductive/Obstetrics                             Anesthesia Physical Anesthesia Plan  ASA: II  Anesthesia Plan:    Post-op Pain Management:    Induction:   PONV Risk Score and Plan:   Airway Management Planned:   Additional Equipment:   Intra-op Plan:   Post-operative Plan:   Informed Consent:   Plan Discussed with:   Anesthesia Plan Comments:         Anesthesia Quick Evaluation

## 2020-09-27 ENCOUNTER — Ambulatory Visit: Payer: Medicaid Other | Admitting: Physician Assistant

## 2020-10-01 ENCOUNTER — Other Ambulatory Visit: Payer: Self-pay | Admitting: Family

## 2020-10-01 DIAGNOSIS — K2100 Gastro-esophageal reflux disease with esophagitis, without bleeding: Secondary | ICD-10-CM

## 2020-10-14 ENCOUNTER — Other Ambulatory Visit: Payer: Self-pay | Admitting: Family

## 2020-10-14 DIAGNOSIS — M545 Low back pain, unspecified: Secondary | ICD-10-CM

## 2020-10-14 DIAGNOSIS — G8929 Other chronic pain: Secondary | ICD-10-CM

## 2020-10-29 ENCOUNTER — Other Ambulatory Visit: Payer: Self-pay | Admitting: Internal Medicine

## 2020-10-29 DIAGNOSIS — K2100 Gastro-esophageal reflux disease with esophagitis, without bleeding: Secondary | ICD-10-CM

## 2020-11-08 ENCOUNTER — Ambulatory Visit: Payer: Medicaid Other | Admitting: Internal Medicine

## 2020-11-17 ENCOUNTER — Encounter: Payer: Self-pay | Admitting: Internal Medicine

## 2020-11-17 ENCOUNTER — Telehealth (INDEPENDENT_AMBULATORY_CARE_PROVIDER_SITE_OTHER): Payer: Self-pay | Admitting: Internal Medicine

## 2020-11-17 DIAGNOSIS — R399 Unspecified symptoms and signs involving the genitourinary system: Secondary | ICD-10-CM

## 2020-11-17 DIAGNOSIS — K2100 Gastro-esophageal reflux disease with esophagitis, without bleeding: Secondary | ICD-10-CM

## 2020-11-17 DIAGNOSIS — M545 Low back pain, unspecified: Secondary | ICD-10-CM

## 2020-11-17 DIAGNOSIS — G8929 Other chronic pain: Secondary | ICD-10-CM

## 2020-11-17 MED ORDER — CEPHALEXIN 500 MG PO CAPS: 500.0000 mg | ORAL_CAPSULE | Freq: Two times a day (BID) | ORAL | 0 refills | Status: DC

## 2020-11-17 MED ORDER — OMEPRAZOLE 40 MG PO CPDR: DELAYED_RELEASE_CAPSULE | ORAL | 2 refills | Status: DC

## 2020-11-17 MED ORDER — CYCLOBENZAPRINE HCL 10 MG PO TABS: 10.0000 mg | ORAL_TABLET | Freq: Three times a day (TID) | ORAL | 1 refills | Status: DC | PRN

## 2020-11-17 MED ORDER — MELOXICAM 15 MG PO TABS: 15.0000 mg | ORAL_TABLET | Freq: Every day | ORAL | 0 refills | Status: DC

## 2020-11-17 NOTE — Progress Notes (Signed)
Virtual Visit via Telephone Note  I connected with Kaylee White, on 11/17/2020 at 3:51 PM by telephone due to the COVID-19 pandemic and verified that I am speaking with the correct person using two identifiers.   Consent: I discussed the limitations, risks, security and privacy concerns of performing an evaluation and management service by telephone and the availability of in person appointments. I also discussed with the patient that there may be a patient responsible charge related to this service. The patient expressed understanding and agreed to proceed.   Location of Patient: Home   Location of Provider: Clinic    Persons participating in Telemedicine visit: Khylee L [facility] Dr. Earlene Plater      History of Present Illness: Patient has a visit for follow up. Has concerns about possible UTI. Experiencing malodorous urine and bladder pressure. Denies dysuria. No hematuria. This has been occurring for several weeks.   Also still experiencing low back pain. Asks for refill for Flexeril. Reports that Voltaren is not working and asks for something different. Also asks for referral to orthopedic doctor. No changes in bowel or bladder. Pain does not radiate down leg. No saddle anesthesia.    Past Medical History:  Diagnosis Date  . Alcohol abuse   . GERD (gastroesophageal reflux disease)   . History of COVID-19 08/27/2020  . MDD (major depressive disorder)   . Substance abuse (HCC)   . Vitamin D deficiency    Allergies  Allergen Reactions  . Aspirin Itching and Nausea And Vomiting  . Tylenol [Acetaminophen] Itching and Nausea And Vomiting    Current Outpatient Medications on File Prior to Visit  Medication Sig Dispense Refill  . cyclobenzaprine (FLEXERIL) 10 MG tablet Take 1 tablet (10 mg total) by mouth 3 (three) times daily as needed for muscle spasms. 30 tablet 1  . diclofenac (VOLTAREN) 50 MG EC tablet TAKE 1 TABLET BY MOUTH TWICE A DAY 60 tablet 0  .  omeprazole (PRILOSEC) 40 MG capsule TAKE 1 CAPSULE BY MOUTH EVERY DAY 30 capsule 1  . clotrimazole-betamethasone (LOTRISONE) cream Apply 1 application topically 2 (two) times daily. For 3 weeks (Patient not taking: No sig reported) 30 g 1   No current facility-administered medications on file prior to visit.    Observations/Objective: NAD. Speaking clearly.  Work of breathing normal.  Alert and oriented. Mood appropriate.   Assessment and Plan: 1. Urinary tract infection symptoms Will treat for presumed acute cystitis with Keflex. No red flag symptoms. Return for evaluation if no improvement with antibiotic therapy.  - cephALEXin (KEFLEX) 500 MG capsule; Take 1 capsule (500 mg total) by mouth 2 (two) times daily.  Dispense: 10 capsule; Refill: 0  2. Chronic bilateral low back pain without sciatica Will place referral to orthopedics for chronic low back pain. Last lumbar imaging performed in 2010 and will likely need to be updated but will defer to specialist. Flexeril refilled. Trial Mobic in place of Voltaren.  - cyclobenzaprine (FLEXERIL) 10 MG tablet; Take 1 tablet (10 mg total) by mouth 3 (three) times daily as needed for muscle spasms.  Dispense: 30 tablet; Refill: 1 - meloxicam (MOBIC) 15 MG tablet; Take 1 tablet (15 mg total) by mouth daily.  Dispense: 30 tablet; Refill: 0 - AMB referral to orthopedics  3. Gastroesophageal reflux disease with esophagitis without hemorrhage - omeprazole (PRILOSEC) 40 MG capsule; TAKE 1 CAPSULE BY MOUTH EVERY DAY  Dispense: 30 capsule; Refill: 2   Follow Up Instructions: PRN and for routine medical  care    I discussed the assessment and treatment plan with the patient. The patient was provided an opportunity to ask questions and all were answered. The patient agreed with the plan and demonstrated an understanding of the instructions.   The patient was advised to call back or seek an in-person evaluation if the symptoms worsen or if the condition  fails to improve as anticipated.     I provided 9 minutes total of non-face-to-face time during this encounter including median intraservice time, reviewing previous notes, investigations, ordering medications, medical decision making, coordinating care and patient verbalized understanding at the end of the visit.    Marcy Siren, D.O. Primary Care at [facility]  11/17/2020, 3:51 PM

## 2020-11-17 NOTE — Progress Notes (Signed)
Address back pain taking current meds but request something stronger Referral for back  RF medication  Request for ATB-  Possible UTI malodorous urine  Denies d/c or dysuria

## 2020-12-23 ENCOUNTER — Other Ambulatory Visit: Payer: Self-pay | Admitting: Internal Medicine

## 2020-12-23 DIAGNOSIS — G8929 Other chronic pain: Secondary | ICD-10-CM

## 2020-12-23 DIAGNOSIS — K2100 Gastro-esophageal reflux disease with esophagitis, without bleeding: Secondary | ICD-10-CM

## 2021-01-04 ENCOUNTER — Telehealth: Payer: Self-pay | Admitting: Internal Medicine

## 2021-01-04 NOTE — Telephone Encounter (Signed)
Patient informed that a specimen will be needed in order to see why an ATB would be needed. She states she will call office back to schedule an appointment on tomorrow. Unable able to come in today.

## 2021-01-04 NOTE — Telephone Encounter (Signed)
Pt requesting refill for cephALEXin (KEFLEX) 500 MG capsule [[id number]]  Please send to  Pharmacy  [id number] [place],  - [address] AT [facility] [address]  [address] [place] [address]  Phone:  [phone] Fax:  [phone]      Thank you

## 2021-01-25 NOTE — Progress Notes (Signed)
Patient ID: CORAL TIMME, female    DOB: 12-24-1970  MRN: [id number]  CC: Possible UTI   Subjective: Kaylee White is a 50 y.o. female who presents for possible UTI.   Her concerns today include:   URINARY SYMPTOMS: Dysuria: no Urinary frequency: no Urgency: no Small volume voids: no Symptom severity: no Urinary incontinence: no Foul odor: yes Hematuria: no Abdominal pain: no Suprapubic pain/pressure: no Flank pain: no Fever:  no Vomiting: no  2. STD TEST: Reports not currently sexually active. Separated from husband at the moment. Last intercourse with husband was about 1 month ago. Denies any symptoms. Would like screening for STD and HIV.   3. PREDIABETES FOLLOW-UP: Diagnosed with prediabetes in November 2021. Hemoglobin A1c 6.1% at that time. Reports she has been watching what she eats.   4. BACK PAIN FOLLOW-UP: Visit 11/17/2020 per DO note: Will place referral to orthopedics for chronic low back pain. Last lumbar imaging performed in 2010 and will likely need to be updated but will defer to specialist. Flexeril refilled. Trial Mobic in place of Voltaren.   01/26/2021: Today still having back pain. Meloxicam not helping. Flexeril helping and requesting refills.   5. GERD FOLLOW-UP: Visit 11/17/2020 per DO note: Omeprazole continued.   01/26/2021: Doing well on current regimen and requesting refills.   6. HEPATITIS TESTING: Reports a weird smell coming from her air conditioner. Would like to be tested for hepatitis.    Current Outpatient Medications on File Prior to Visit  Medication Sig Dispense Refill   FLUoxetine (PROZAC) 20 MG capsule Take 1 capsule by mouth daily.     cephALEXin (KEFLEX) 500 MG capsule Take 1 capsule (500 mg total) by mouth 2 (two) times daily. 10 capsule 0   meloxicam (MOBIC) 15 MG tablet TAKE 1 TABLET (15 MG TOTAL) BY MOUTH DAILY. 30 tablet 0   No current facility-administered medications on file prior to visit.    Allergies   Allergen Reactions   Aspirin Itching and Nausea And Vomiting   Tylenol [Acetaminophen] Itching and Nausea And Vomiting    Social History   Socioeconomic History   Marital status: Divorced    Spouse name: Not on file   Number of children: Not on file   Years of education: Not on file   Highest education level: Not on file  Occupational History   Not on file  Tobacco Use   Smoking status: Some Days    Packs/day: 0.25    Pack years: 0.00    Types: Cigarettes   Smokeless tobacco: Never  Substance and Sexual Activity   Alcohol use: Yes    Comment: cant tell us how much a day, but several beers a day   Drug use: Yes    Types: Cocaine, Marijuana    Comment: last time for marijuana 2021, cocaine last week 07/2020   Sexual activity: Not on file  Other Topics Concern   Not on file  Social History Narrative   Not on file   Social Determinants of Health   Financial Resource Strain: Not on file  Food Insecurity: Not on file  Transportation Needs: Not on file  Physical Activity: Not on file  Stress: Not on file  Social Connections: Not on file  Intimate Partner Violence: Not on file    Family History  Problem Relation Age of Onset   COPD Mother    Asthma Father    Eczema Father    Epilepsy Maternal Aunt     Past  Surgical History:  Procedure Laterality Date   CHOLECYSTECTOMY     MANDIBLE RECONSTRUCTION     MULTIPLE TOOTH EXTRACTIONS      ROS: Review of Systems Negative except as stated above  PHYSICAL EXAM: BP (!) 125/92 (BP Location: Left Arm, Patient Position: Sitting, Cuff Size: Normal)   Pulse (!) 112   Temp 98.1 F (36.7 C)   Resp 15   Ht 5' 0.98" (1.549 m)   Wt 167 lb 9.6 oz (76 kg)   LMP 05/30/2019 (LMP Unknown)   SpO2 97%   BMI 31.68 kg/m   Physical Exam General appearance - alert, well appearing, and in no distress and oriented to person, place, and time Mental status - alert, oriented to person, place, and time, normal mood, behavior, speech,  dress, motor activity, and thought processes Chest - clear to auscultation, no wheezes, rales or rhonchi, symmetric air entry, no tachypnea, retractions or cyanosis Heart - normal rate, regular rhythm, normal S1, S2, no murmurs, rubs, clicks or gallops Neurological - alert, oriented, normal speech, no focal findings or movement disorder noted  Results for orders placed or performed in visit on 01/26/21  POCT URINALYSIS DIP (CLINITEK)  Result Value Ref Range   Color, UA yellow yellow   Clarity, UA clear clear   Glucose, UA negative negative mg/dL   Bilirubin, UA negative negative   Ketones, POC UA negative negative mg/dL   Spec Grav, UA >=8.756 (A) 1.010 - 1.025   Blood, UA small (A) negative   pH, UA 5.0 5.0 - 8.0   POC PROTEIN,UA negative negative, trace   Urobilinogen, UA 0.2 0.2 or 1.0 E.U./dL   Nitrite, UA Negative Negative   Leukocytes, UA Negative Negative    ASSESSMENT AND PLAN: 1. Lower urinary tract symptoms: - Urinalysis negative for urinary tract infection.  - POCT URINALYSIS DIP (CLINITEK)  2. Routine screening for STI (sexually transmitted infection): - Cervicovaginal self-swab to screen for chlamydia, gonorrhea, trichomonas, bacterial vaginitis, and candida vaginitis. - Cervicovaginal ancillary only  3. Encounter for screening for HIV: - HIV antibody to screen for human immunodeficiency virus.  - HIV antibody (with reflex)  4. Prediabetes: - Last hemoglobin obtained 06/24/2020 and was 6.1% at that time.  - Hemoglobin A1c to screen for level of prediabetes control.  - Hemoglobin A1c  5. Chronic bilateral low back pain without sciatica: - Meloxicam discontinued per patient preference.  - Begin Ibuprofen as prescribed.  - Continue Cyclobenzaprine as prescribed.  - Referral to Orthopedic Surgery for further evaluation and management.  - Follow-up with primary provider as scheduled.  - ibuprofen (ADVIL) 800 MG tablet; Take 1 tablet (800 mg total) by mouth every  8 (eight) hours as needed for moderate pain.  Dispense: 30 tablet; Refill: 0 - cyclobenzaprine (FLEXERIL) 10 MG tablet; Take 1 tablet (10 mg total) by mouth 3 (three) times daily as needed for muscle spasms.  Dispense: 30 tablet; Refill: 1 - Ambulatory referral to Orthopedic Surgery  6. Gastroesophageal reflux disease with esophagitis without hemorrhage: - Continue Omeprazole as prescribed.  - Follow-up with primary provider as scheduled.  - omeprazole (PRILOSEC) 40 MG capsule; Take 1 capsule (40 mg total) by mouth daily.  Dispense: 90 capsule; Refill: 0  7. Need for hepatitis C screening test: - HAV, HBV, HCV to screen for hepatitis. - HAV, HBV, HCV  8. Need for 23-polyvalent pneumococcal polysaccharide vaccine: - Administered today in office.  - Pneumococcal polysaccharide vaccine 23-valent greater than or equal to 2yo subcutaneous/IM  Patient  was given the opportunity to ask questions.  Patient verbalized understanding of the plan and was able to repeat key elements of the plan. Patient was given clear instructions to go to Emergency Department or return to medical center if symptoms don't improve, worsen, or new problems develop.The patient verbalized understanding.   Orders Placed This Encounter  Procedures   Pneumococcal polysaccharide vaccine 23-valent greater than or equal to 2yo subcutaneous/IM   HIV antibody (with reflex)   Hemoglobin A1c   HAV, HBV, HCV   Ambulatory referral to Orthopedic Surgery   POCT URINALYSIS DIP (CLINITEK)     Requested Prescriptions   Signed Prescriptions Disp Refills   ibuprofen (ADVIL) 800 MG tablet 30 tablet 0    Sig: Take 1 tablet (800 mg total) by mouth every 8 (eight) hours as needed for moderate pain.   cyclobenzaprine (FLEXERIL) 10 MG tablet 30 tablet 1    Sig: Take 1 tablet (10 mg total) by mouth 3 (three) times daily as needed for muscle spasms.   omeprazole (PRILOSEC) 40 MG capsule 90 capsule 0    Sig: Take 1 capsule (40 mg total)  by mouth daily.    Follow-up with primary provider as scheduled.   Rema Fendt, NP

## 2021-01-26 ENCOUNTER — Ambulatory Visit (INDEPENDENT_AMBULATORY_CARE_PROVIDER_SITE_OTHER): Payer: 59 | Admitting: Family

## 2021-01-26 ENCOUNTER — Other Ambulatory Visit: Payer: Self-pay

## 2021-01-26 ENCOUNTER — Other Ambulatory Visit (HOSPITAL_COMMUNITY)
Admission: RE | Admit: 2021-01-26 | Discharge: 2021-01-26 | Disposition: A | Discharge status: Home / Self Care | Payer: 59 | Source: Ambulatory Visit | Attending: Family | Admitting: Family

## 2021-01-26 ENCOUNTER — Telehealth: Payer: Self-pay | Admitting: Family

## 2021-01-26 VITALS — BP 125/92 | HR 112 | Temp 98.1°F | Resp 15 | Ht 60.98 in | Wt 167.6 lb

## 2021-01-26 DIAGNOSIS — R399 Unspecified symptoms and signs involving the genitourinary system: Secondary | ICD-10-CM | POA: Diagnosis present

## 2021-01-26 DIAGNOSIS — Z23 Encounter for immunization: Secondary | ICD-10-CM | POA: Diagnosis not present

## 2021-01-26 DIAGNOSIS — R7303 Prediabetes: Secondary | ICD-10-CM | POA: Diagnosis not present

## 2021-01-26 DIAGNOSIS — Z114 Encounter for screening for human immunodeficiency virus [HIV]: Secondary | ICD-10-CM

## 2021-01-26 DIAGNOSIS — K2100 Gastro-esophageal reflux disease with esophagitis, without bleeding: Secondary | ICD-10-CM

## 2021-01-26 DIAGNOSIS — Z113 Encounter for screening for infections with a predominantly sexual mode of transmission: Secondary | ICD-10-CM

## 2021-01-26 DIAGNOSIS — M545 Low back pain, unspecified: Secondary | ICD-10-CM

## 2021-01-26 DIAGNOSIS — Z1159 Encounter for screening for other viral diseases: Secondary | ICD-10-CM

## 2021-01-26 DIAGNOSIS — G8929 Other chronic pain: Secondary | ICD-10-CM | POA: Diagnosis not present

## 2021-01-26 LAB — POCT URINALYSIS DIP (CLINITEK)
Bilirubin, UA: NEGATIVE
Glucose, UA: NEGATIVE mg/dL
Ketones, POC UA: NEGATIVE mg/dL
Leukocytes, UA: NEGATIVE
Nitrite, UA: NEGATIVE
POC PROTEIN,UA: NEGATIVE
Spec Grav, UA: >=1.03 — AB (ref 1.010–1.025)
Urobilinogen, UA: 0.2 E.U./dL
pH, UA: 5 (ref 5.0–8.0)

## 2021-01-26 MED ORDER — OMEPRAZOLE 40 MG PO CPDR: 40.0000 mg | DELAYED_RELEASE_CAPSULE | Freq: Every day | ORAL | 0 refills | Status: DC

## 2021-01-26 MED ORDER — CYCLOBENZAPRINE HCL 10 MG PO TABS: 10.0000 mg | ORAL_TABLET | Freq: Three times a day (TID) | ORAL | 1 refills | Status: DC | PRN

## 2021-01-26 MED ORDER — IBUPROFEN 800 MG PO TABS: 800.0000 mg | ORAL_TABLET | Freq: Three times a day (TID) | ORAL | 0 refills | Status: DC | PRN

## 2021-01-26 NOTE — Progress Notes (Signed)
Pt presents for UTI symptoms which include urinary frequency  Needs refills on medications, but meloxicam not working so wants to try something different Needs referral to Ortho for back pain Desires STD, HIV, HEP C and A1c testing

## 2021-01-26 NOTE — Telephone Encounter (Signed)
Called pt and lvm letting her know the text was a link for her to get her my chart set up.

## 2021-01-26 NOTE — Telephone Encounter (Signed)
Pt called stating she received a text to check her mychart. Pt stated she was unable to get log in, I then helped pt and reset password. Pt called back stating she still could not get in her mychart. Pt would like a call back from the nurse to know what was put on mychart.

## 2021-01-27 ENCOUNTER — Other Ambulatory Visit: Payer: Self-pay | Admitting: Family

## 2021-01-27 DIAGNOSIS — N760 Acute vaginitis: Secondary | ICD-10-CM | POA: Insufficient documentation

## 2021-01-27 LAB — HEMOGLOBIN A1C
Est. average glucose Bld gHb Est-mCnc: 128 mg/dL
Hgb A1c MFr Bld: 6.1 % — ABNORMAL HIGH (ref 4.8–5.6)

## 2021-01-27 LAB — HAV, HBV, HCV
HCV Ab: <0.1 {s_co_ratio} (ref 0.0–0.9)
Hep B Core Total Ab: NEGATIVE
Hep B Surface Ab, Qual: NONREACTIVE
Hepatitis B Surface Ag: NEGATIVE
hep A Total Ab: NEGATIVE

## 2021-01-27 LAB — HIV ANTIBODY (ROUTINE TESTING W REFLEX): HIV Screen 4th Generation wRfx: NONREACTIVE

## 2021-01-27 LAB — CERVICOVAGINAL ANCILLARY ONLY
Bacterial Vaginitis (gardnerella): POSITIVE — AB
Candida Glabrata: NEGATIVE
Candida Vaginitis: NEGATIVE
Chlamydia: NEGATIVE
Comment: NEGATIVE
Comment: NEGATIVE
Comment: NEGATIVE
Comment: NEGATIVE
Comment: NEGATIVE
Comment: NORMAL
Neisseria Gonorrhea: NEGATIVE
Trichomonas: NEGATIVE

## 2021-01-27 LAB — HCV INTERPRETATION

## 2021-01-27 MED ORDER — METRONIDAZOLE 500 MG PO TABS: 500.0000 mg | ORAL_TABLET | Freq: Two times a day (BID) | ORAL | 0 refills | Status: AC

## 2021-01-27 NOTE — Progress Notes (Signed)
Gonorrhea, Chlamydia, Trichomonas, and Yeast Infection negative.  Positive for Bacterial Vaginitis, an overgrowth of normal bacteria in the vagina due to changes in pH often related to semen, menstrual periods, or soaps. Prescribed (Metronidazole) Flagyl twice per day for 7 days. Do not drink alcohol while taking this medication.

## 2021-01-27 NOTE — Progress Notes (Signed)
Urinalysis discussed in office.   Hepatitis panel negative.   HIV negative.   Hemoglobin A1c is consistent with pre-diabetes and the same as 7 months ago. Practice healthy eating habits of fresh fruit and vegetables, lean baked meats such as chicken, fish, and [place]; limit breads, rice, pastas, and desserts; practice regular aerobic exercise (at least 150 minutes a week as tolerated). Encouraged to recheck in 6 months.

## 2021-01-28 ENCOUNTER — Telehealth: Payer: Self-pay | Admitting: Family

## 2021-01-28 NOTE — Telephone Encounter (Signed)
Patient called in asking for her lab results from her 01/26/2021 office visit.

## 2021-01-31 NOTE — Telephone Encounter (Signed)
Patient called asking for her lab results. Pt stated she is unable to get into her MyChart. She was sent a link from the nurse and I also tried to help her as well.

## 2021-02-07 ENCOUNTER — Ambulatory Visit: Payer: 59 | Admitting: Physician Assistant

## 2021-03-01 ENCOUNTER — Ambulatory Visit: Payer: Medicaid Other

## 2021-03-14 ENCOUNTER — Other Ambulatory Visit: Payer: Self-pay | Admitting: Family

## 2021-03-14 DIAGNOSIS — M545 Low back pain, unspecified: Secondary | ICD-10-CM

## 2021-03-14 DIAGNOSIS — G8929 Other chronic pain: Secondary | ICD-10-CM

## 2021-03-17 ENCOUNTER — Other Ambulatory Visit: Payer: Self-pay

## 2021-03-17 DIAGNOSIS — M545 Low back pain, unspecified: Secondary | ICD-10-CM

## 2021-03-17 MED ORDER — IBUPROFEN 800 MG PO TABS
800.0000 mg | ORAL_TABLET | Freq: Three times a day (TID) | ORAL | 0 refills | Status: DC | PRN
Start: 2021-03-17 — End: 2022-08-31

## 2021-03-28 ENCOUNTER — Telehealth: Payer: Self-pay | Admitting: Family

## 2021-03-28 NOTE — Telephone Encounter (Signed)
Patient called in stating she needs to leave a message for the nurse because she has a UTI and would like something called in. I informed the patient she would need to be seen before any medication can be called in. Patient then stated well I would like to leave a message to have the nurse call me back.

## 2021-03-30 NOTE — Telephone Encounter (Signed)
Patient called in stating she needs the nurse to call her back. I informed the patient the nurse was seeing patients and I can take a message. Patient was adamant to have nurse call her back. I asked patient what it was concering, she stated I need an antibiotic called in. I informed patient that medications are not called in without being seen. Patient then stated I was just seen.

## 2021-03-31 NOTE — Telephone Encounter (Signed)
Pt contacted wanted an antibiotic sent over claiming to have UTI, advised pt she needs an appt we can not send in any antibiotics without appt, pt hung up

## 2021-06-16 DIAGNOSIS — R7303 Prediabetes: Secondary | ICD-10-CM | POA: Insufficient documentation

## 2021-06-16 NOTE — Progress Notes (Signed)
Erroneous encounter

## 2021-06-17 ENCOUNTER — Encounter: Payer: 59 | Admitting: Family

## 2021-06-17 DIAGNOSIS — Z1329 Encounter for screening for other suspected endocrine disorder: Secondary | ICD-10-CM

## 2021-06-17 DIAGNOSIS — Z13228 Encounter for screening for other metabolic disorders: Secondary | ICD-10-CM

## 2021-06-17 DIAGNOSIS — Z13220 Encounter for screening for lipoid disorders: Secondary | ICD-10-CM

## 2021-06-17 DIAGNOSIS — R7303 Prediabetes: Secondary | ICD-10-CM

## 2021-06-17 DIAGNOSIS — Z130 Encounter for screening for diseases of the blood and blood-forming organs and certain disorders involving the immune mechanism: Secondary | ICD-10-CM

## 2021-06-17 DIAGNOSIS — Z0000 Encounter for general adult medical examination without abnormal findings: Secondary | ICD-10-CM

## 2021-06-17 DIAGNOSIS — Z1211 Encounter for screening for malignant neoplasm of colon: Secondary | ICD-10-CM

## 2021-08-22 ENCOUNTER — Ambulatory Visit: Payer: Medicaid Other

## 2021-09-16 NOTE — Progress Notes (Deleted)
Patient ID: Kaylee White, female    DOB: [birth year]  MRN: [id number]  CC: Annual Physical Exam  Subjective: Kaylee White is a 51 y.o. female who presents for annual physical exam.   Her concerns today include:  NEED MAMMO  NEED COLON CA  NEED FLU  NEED SHINGRIX  Patient Active Problem List   Diagnosis Date Noted   Prediabetes 06/16/2021   Bacterial vaginitis 01/27/2021     Current Outpatient Medications on File Prior to Visit  Medication Sig Dispense Refill   cephALEXin (KEFLEX) 500 MG capsule Take 1 capsule (500 mg total) by mouth 2 (two) times daily. 10 capsule 0   cyclobenzaprine (FLEXERIL) 10 MG tablet Take 1 tablet (10 mg total) by mouth 3 (three) times daily as needed for muscle spasms. 30 tablet 1   FLUoxetine (PROZAC) 20 MG capsule Take 1 capsule by mouth daily.     ibuprofen (ADVIL) 800 MG tablet Take 1 tablet (800 mg total) by mouth every 8 (eight) hours as needed for moderate pain. 30 tablet 0   omeprazole (PRILOSEC) 40 MG capsule Take 1 capsule (40 mg total) by mouth daily. 90 capsule 0   No current facility-administered medications on file prior to visit.    Allergies  Allergen Reactions   Aspirin Itching and Nausea And Vomiting   Tylenol [Acetaminophen] Itching and Nausea And Vomiting    Social History   Socioeconomic History   Marital status: Divorced    Spouse name: Not on file   Number of children: Not on file   Years of education: Not on file   Highest education level: Not on file  Occupational History   Not on file  Tobacco Use   Smoking status: Some Days    Packs/day: 0.25    Types: Cigarettes   Smokeless tobacco: Never  Substance and Sexual Activity   Alcohol use: Yes    Comment: cant tell us how much a day, but several beers a day   Drug use: Yes    Types: Cocaine, Marijuana    Comment: last time for marijuana 2021, cocaine last week 07/2020   Sexual activity: Not on file  Other Topics Concern   Not on file  Social History  Narrative   Not on file   Social Determinants of Health   Financial Resource Strain: Not on file  Food Insecurity: Not on file  Transportation Needs: Not on file  Physical Activity: Not on file  Stress: Not on file  Social Connections: Not on file  Intimate Partner Violence: Not on file    Family History  Problem Relation Age of Onset   COPD Mother    Asthma Father    Eczema Father    Epilepsy Maternal Aunt     Past Surgical History:  Procedure Laterality Date   CHOLECYSTECTOMY     MANDIBLE RECONSTRUCTION     MULTIPLE TOOTH EXTRACTIONS      ROS: Review of Systems Negative except as stated above  PHYSICAL EXAM: LMP 05/30/2019 (LMP Unknown)   Physical Exam  {female adult master:310786} {female adult master:310785}  CMP Latest Ref Rng & Units 08/20/2020 06/24/2020 04/15/2019  Glucose 70 - 99 mg/dL 79 88 119(J)  BUN 6 - 20 mg/dL 9 14 7   Creatinine 0.44 - 1.00 mg/dL 4.78 2.95)  Sodium 135 - 145 mmol/L 141 142 136  Potassium 3.5 - 5.1 mmol/L 4.4 4.8 3.5  Chloride 98 - 111 mmol/L 108 103 104  CO2 22 - 32 mmol/L  23 23 21(L)  Calcium 8.9 - 10.3 mg/dL 9.6 9.8 2.5(D)  Total Protein 6.5 - 8.1 g/dL 7.8 8.2 -  Total Bilirubin 0.3 - 1.2 mg/dL 0.5 0.3 -  Alkaline Phos 38 - 126 U/L 91 104 -  AST 15 - 41 U/L 29 18 -  ALT 0 - 44 U/L 39 28 -   Lipid Panel     Component Value Date/Time   CHOL 304 (H) 06/24/2020 1037   TRIG 265 (H) 06/24/2020 1037   HDL 63 06/24/2020 1037   CHOLHDL 4.8 (H) 06/24/2020 1037   LDLCALC 190 (H) 06/24/2020 1037    CBC    Component Value Date/Time   WBC 9.5 06/24/2020 1037   WBC 12.2 (H) 04/15/2019 1435   RBC 4.59 06/24/2020 1037   RBC 4.32 04/15/2019 1435   HGB 13.9 06/24/2020 1037   HCT 42.6 06/24/2020 1037   PLT 428 06/24/2020 1037   MCV 93 06/24/2020 1037   MCH 30.3 06/24/2020 1037   MCH 28.2 04/15/2019 1435   MCHC 32.6 06/24/2020 1037   MCHC 31.2 04/15/2019 1435   RDW 13.1 06/24/2020 1037   LYMPHSABS 3.0 06/24/2020 1037    MONOABS 0.7 10/20/2017 0138   EOSABS 0.2 06/24/2020 1037   BASOSABS 0.1 06/24/2020 1037    ASSESSMENT AND PLAN:  There are no diagnoses linked to this encounter.   Patient was given the opportunity to ask questions.  Patient verbalized understanding of the plan and was able to repeat key elements of the plan. Patient was given clear instructions to go to Emergency Department or return to medical center if symptoms don't improve, worsen, or new problems develop.The patient verbalized understanding.   No orders of the defined types were placed in this encounter.    Requested Prescriptions    No prescriptions requested or ordered in this encounter    No follow-ups on file.  Rema Fendt, NP

## 2021-09-23 ENCOUNTER — Encounter: Payer: Medicaid Other | Admitting: Family

## 2021-09-23 DIAGNOSIS — Z13220 Encounter for screening for lipoid disorders: Secondary | ICD-10-CM

## 2021-09-23 DIAGNOSIS — Z1329 Encounter for screening for other suspected endocrine disorder: Secondary | ICD-10-CM

## 2021-09-23 DIAGNOSIS — Z0000 Encounter for general adult medical examination without abnormal findings: Secondary | ICD-10-CM

## 2021-09-23 DIAGNOSIS — R7303 Prediabetes: Secondary | ICD-10-CM

## 2021-09-23 DIAGNOSIS — Z13228 Encounter for screening for other metabolic disorders: Secondary | ICD-10-CM

## 2021-09-23 DIAGNOSIS — Z1211 Encounter for screening for malignant neoplasm of colon: Secondary | ICD-10-CM

## 2021-09-23 DIAGNOSIS — Z130 Encounter for screening for diseases of the blood and blood-forming organs and certain disorders involving the immune mechanism: Secondary | ICD-10-CM

## 2021-09-23 DIAGNOSIS — Z1231 Encounter for screening mammogram for malignant neoplasm of breast: Secondary | ICD-10-CM

## 2021-10-01 NOTE — Progress Notes (Signed)
Erroneous encounter

## 2021-10-07 ENCOUNTER — Encounter: Payer: Medicaid Other | Admitting: Family

## 2021-10-07 DIAGNOSIS — Z13228 Encounter for screening for other metabolic disorders: Secondary | ICD-10-CM

## 2021-10-07 DIAGNOSIS — Z0000 Encounter for general adult medical examination without abnormal findings: Secondary | ICD-10-CM

## 2021-10-07 DIAGNOSIS — Z13220 Encounter for screening for lipoid disorders: Secondary | ICD-10-CM

## 2021-10-07 DIAGNOSIS — Z130 Encounter for screening for diseases of the blood and blood-forming organs and certain disorders involving the immune mechanism: Secondary | ICD-10-CM

## 2021-10-07 DIAGNOSIS — R7303 Prediabetes: Secondary | ICD-10-CM

## 2021-10-07 DIAGNOSIS — Z1211 Encounter for screening for malignant neoplasm of colon: Secondary | ICD-10-CM

## 2021-10-07 DIAGNOSIS — Z1231 Encounter for screening mammogram for malignant neoplasm of breast: Secondary | ICD-10-CM

## 2021-10-07 DIAGNOSIS — Z1329 Encounter for screening for other suspected endocrine disorder: Secondary | ICD-10-CM

## 2021-12-06 ENCOUNTER — Ambulatory Visit: Payer: Medicaid Other

## 2021-12-06 DIAGNOSIS — Z111 Encounter for screening for respiratory tuberculosis: Secondary | ICD-10-CM

## 2021-12-06 NOTE — Addendum Note (Signed)
Addended by: Melene Plan on: 12/06/2021 04:11 PM ? ? Modules accepted: Orders ? ?

## 2021-12-06 NOTE — Progress Notes (Signed)
Patient came in for PPD  testing today. Pay will return on  Friday for results ?

## 2021-12-09 ENCOUNTER — Ambulatory Visit: Payer: Medicaid Other

## 2021-12-09 ENCOUNTER — Ambulatory Visit: Payer: Medicaid Other | Admitting: Family

## 2021-12-18 NOTE — Progress Notes (Signed)
Erroneous encounter-disregard

## 2021-12-21 ENCOUNTER — Encounter: Payer: Self-pay | Admitting: Family

## 2021-12-21 DIAGNOSIS — Z130 Encounter for screening for diseases of the blood and blood-forming organs and certain disorders involving the immune mechanism: Secondary | ICD-10-CM

## 2021-12-21 DIAGNOSIS — Z1211 Encounter for screening for malignant neoplasm of colon: Secondary | ICD-10-CM

## 2021-12-21 DIAGNOSIS — Z13228 Encounter for screening for other metabolic disorders: Secondary | ICD-10-CM

## 2021-12-21 DIAGNOSIS — Z113 Encounter for screening for infections with a predominantly sexual mode of transmission: Secondary | ICD-10-CM

## 2021-12-21 DIAGNOSIS — Z13220 Encounter for screening for lipoid disorders: Secondary | ICD-10-CM

## 2021-12-21 DIAGNOSIS — Z1329 Encounter for screening for other suspected endocrine disorder: Secondary | ICD-10-CM

## 2021-12-21 DIAGNOSIS — R7303 Prediabetes: Secondary | ICD-10-CM

## 2021-12-21 DIAGNOSIS — Z0000 Encounter for general adult medical examination without abnormal findings: Secondary | ICD-10-CM

## 2021-12-21 DIAGNOSIS — Z1231 Encounter for screening mammogram for malignant neoplasm of breast: Secondary | ICD-10-CM

## 2021-12-26 ENCOUNTER — Ambulatory Visit: Payer: Medicaid Other

## 2021-12-27 ENCOUNTER — Ambulatory Visit: Payer: Medicaid Other

## 2021-12-27 DIAGNOSIS — Z111 Encounter for screening for respiratory tuberculosis: Secondary | ICD-10-CM

## 2021-12-29 ENCOUNTER — Ambulatory Visit: Payer: Medicaid Other

## 2022-02-13 ENCOUNTER — Encounter: Payer: Self-pay | Admitting: Physician Assistant

## 2022-02-13 ENCOUNTER — Ambulatory Visit (INDEPENDENT_AMBULATORY_CARE_PROVIDER_SITE_OTHER): Payer: Self-pay

## 2022-02-13 ENCOUNTER — Ambulatory Visit (INDEPENDENT_AMBULATORY_CARE_PROVIDER_SITE_OTHER): Payer: Self-pay | Admitting: Physician Assistant

## 2022-02-13 ENCOUNTER — Telehealth: Payer: Self-pay | Admitting: Family Medicine

## 2022-02-13 DIAGNOSIS — Z111 Encounter for screening for respiratory tuberculosis: Secondary | ICD-10-CM

## 2022-02-13 NOTE — Progress Notes (Unsigned)
tb 

## 2022-02-13 NOTE — Telephone Encounter (Signed)
Pt is calling to see if she can come and have another TB test today after lunch. Pt had TB test but did not have it read in time. Please advise Pt states she need the test ASAP. CB- [phone]

## 2022-02-13 NOTE — Telephone Encounter (Signed)
Patient need appt to see provider.

## 2022-02-16 ENCOUNTER — Telehealth: Payer: Self-pay | Admitting: Physician Assistant

## 2022-02-16 ENCOUNTER — Ambulatory Visit: Payer: Medicaid Other

## 2022-02-16 ENCOUNTER — Telehealth: Payer: Self-pay | Admitting: *Deleted

## 2022-02-16 NOTE — Telephone Encounter (Signed)
Called place to nurse that gave patient TB test on Monday. According to nurse patient should have had skin test read before 1500 02/16/2022. Patient came in late and Dr.Wilson was asked to read skin test or talk with patient about needing another test.   Patient was called back and Dr. Andrey Campanile  tried to explain that she may need another test since she was late. Patient was very disrespectful to provider.   Patient has had several appt to get her TB done and read.Patient had refused to let Dr  Andrey Campanile nurse read her results back in April. Patient said the nurse put a hole in her arm and refused to come back and get it read.   The next appt that pt was given she did not show up.

## 2022-02-16 NOTE — Telephone Encounter (Signed)
Pt was on the nurse schedule for [facility] to have her TB test read.  Called pt to inform her that the test was place by our nurse who is normally on [employer], but was in our clinic on 02-13-22 seeing some overflow patients due to bus not going out that day. Pt was told by the nurse that day that she would need to come to [facility] on 02-16-22 to have it read. I called the patient to inform her of the location of the bus. The patient got very loud and disrespectful, stating that we should tell patients when a provider from another site is "touching" them. She stated that she was the type of person who would contact a Clinical research associate. I gave the patient the address for the Mobile bus and cancelled the appointment on the clinic schedule. Later in the morning, I noticed that the patient had been put on again by the PCE. This appointment has been cancelled as well.

## 2022-02-20 ENCOUNTER — Ambulatory Visit: Payer: Medicaid Other

## 2022-02-21 ENCOUNTER — Encounter: Payer: Self-pay | Admitting: Family Medicine

## 2022-02-21 ENCOUNTER — Telehealth: Payer: Self-pay | Admitting: Family Medicine

## 2022-02-21 NOTE — Telephone Encounter (Signed)
History of patient visits: Former patient of Marcy Siren. Pt assigned to Dr. Georganna Skeans, PCP. 01-26-21 - 1st appointment w/Amy Zonia Kief 06-17-21 - No Showed CPE appt. 08-22-21 - Pt cancelled Nurse visit for TB test for work 09-23-21 - Pt cancelled CPE w/Amy 10-07-21 - Pt No showed CPE appt w/Amy 12-06-21 - Pt arrived for Nurse visit for TB test for work 12-09-21 - Pt cancelled OV w/Amy 12-09-21 - Pt No showed Nurse visit for TB test reading 12-21-21 - Pt No showed OV w/Amy 12-26-21 - Pt No showed/Cancelled within 24 hours for Nurse visit for TB test 12-27-21 - Pt cancelled Nurse visit for TB test 12-29-21 - Pt No Showed Nurse visit for TB test/Practice Adminstrator had talked to the patient about rescheduling her again. The patient said that it was "Urgent" for her to get this done for work. I mentioned that she had never come to get the one read from back in April. She went on to say that the person that had done that one had put a big hole in her arm. I asked the patient if she had called the clinic to advise and she said no. She said that she did not want that person doing her test again, but I informed her that was the only clinical person that I had in the office that day. As noted above the patient No Showed. 02-13-22 - Pt called via PEC stating she "urgently" needed TB test for work. PEC advised patient they would need an OV w/provider due to not being seen in over a year. Scheduled w/Cari Mayers, PA as she was working in the clinic that day as the [employer] was not running that day. Pt was checked in at 2:48pm and once in the exam room being worked up, stated that she was only her for a TB test and did not want the OV. Lily Repert, RN cancelled that OV and put pt on the Nurse schedule for TB Test. TB test placement was documented at 3:07pm. Pt was given instructions to come to the [facility] on 02-15-22 to have test read. The address of where the bus was in [place] was given to patient.  02-16-22 -  Pt was scheduled by PEC to come into the clinic to have TB test read. Practice Administrator called patient to see why she did not go on 02-15-22, as instructed, to have the test read.  The patient stated that she felt that that would be too soon to have it read and she felt like it should be read on 02-16-22 instead. I informed the patient that the person who placed it was on the Bus, not in the clinic and gave her the address of where the [facility] bus was in [facility] that day. The patient began to get very loud and rude, and said that we should be telling people before they see someone that happened to be in our clinic that day, but actually works on a bus, so that a patient can decide themselves if they want to see them.  She talked over me the entire time and was very rude, loud and disrespectful. She went on and on that we should not have patients being seen by people who worked on a bus, as patients may have transportation issues and may not can get to wherever the bus it. (Pt was aware before she left the clinic on 02-13-22 that she was being asked to return to a bus, not the clinic, but  did not seem to have any qualms at that time). I informed the patient that I was cancelling the appointment for 3:30 that afternoon and she appeared to be going to the bus site.  Right before lunch, it was noticed that the patient had been put back on the schedule for 3:30, same day at the clinic. This appointment was cancelled by PA as well.  Clinic staff spoke with Dr. Andrey Campanile in regards to the situation and Dr. Andrey Campanile informed us that it had to be read within 72 hours and if the patient showed up past 3pm that it would not be valid.  The patient showed up at approx. 3:45pm and was brought back to the nurse's desk and Dr. Andrey Campanile informed the patient that it was too late to read it. The patient started getting loud and said that she wasn't seen on 02-13-22 until 4pm or later and it should be fine. I tried to speak up and let the  patient know that it was documented at 3:07pm which meant that she had had it shortly before then, but she continued to get louder and called "her" [place]) a lie. She continuously talked over Dr. Andrey Campanile, myself, the CMA that was present.  She kept talking about [place] having people in here working that were from a bus, that we should only have nurses that work here seeing patients. Dr. Andrey Campanile told her that she needed to lower her voice and the patient got into Dr. Tawana Scale face and started making hand motions to resemble a mouth talking. She did this right in Dr. Tawana Scale face. Dr. Andrey Campanile told her that she needed to leave and was trying to walk past the patient, as she still had patients in exam rooms to see. I went and opened the door to escort the patient to the lobby, with her the whole time talking very loudly and disrespectful.  02-20-22 - The PEC scheduled the patient again for a Nurse visit to have another TB test placed. I discussed with Dr. Andrey Campanile and she did not want the patient back her. I called the patient and advised that due to the prior episode, that she would have to go somewhere else to have her test done. I did give her info to go to [facility] to have it done and gave her the address and phone #.  The patient stated that she had tried calling back the day of the incident to apologize, but we never received any calls from the [facility] from the patient. The patient started getting loud again and stated that she was the kind of person to get a lawyer, she called Dr. Andrey Campanile a BITCH and hoped that she lost all of her patients and then hung up on me.  02-20-22 - CRM created from the pt calling stating that Employee Health was cash only and she needed to go somewhere that took her insurance. She also wanted info about other PCP's.  02-21-22 - Practice Admin sent pt a dismissal letter via MyChart due to both No shows/Cancellations and disrepectful and threatening behavior.

## 2022-03-03 ENCOUNTER — Ambulatory Visit: Payer: Medicaid Other | Admitting: Physician Assistant

## 2022-03-13 ENCOUNTER — Ambulatory Visit: Payer: Medicaid Other | Admitting: Physician Assistant

## 2022-03-20 ENCOUNTER — Ambulatory Visit: Payer: Medicaid Other | Admitting: Physician Assistant

## 2022-04-08 ENCOUNTER — Emergency Department (HOSPITAL_COMMUNITY): Payer: Medicaid Other

## 2022-04-08 ENCOUNTER — Encounter (HOSPITAL_COMMUNITY): Payer: Self-pay

## 2022-04-08 ENCOUNTER — Other Ambulatory Visit: Payer: Self-pay

## 2022-04-08 ENCOUNTER — Emergency Department (HOSPITAL_COMMUNITY)
Admission: EM | Admit: 2022-04-08 | Discharge: 2022-04-08 | Disposition: A | Discharge status: Home / Self Care | Payer: Self-pay | Attending: Emergency Medicine | Admitting: Emergency Medicine

## 2022-04-08 DIAGNOSIS — X501XXA Overexertion from prolonged static or awkward postures, initial encounter: Secondary | ICD-10-CM | POA: Insufficient documentation

## 2022-04-08 DIAGNOSIS — S92355A Nondisplaced fracture of fifth metatarsal bone, left foot, initial encounter for closed fracture: Secondary | ICD-10-CM | POA: Insufficient documentation

## 2022-04-08 DIAGNOSIS — S9032XA Contusion of left foot, initial encounter: Secondary | ICD-10-CM | POA: Insufficient documentation

## 2022-04-08 LAB — CBG MONITORING, ED: Glucose-Capillary: 108 mg/dL — ABNORMAL HIGH (ref 70–99)

## 2022-04-08 MED ORDER — IBUPROFEN 400 MG PO TABS
600.0000 mg | ORAL_TABLET | Freq: Once | ORAL | Status: AC
  Administered 2022-04-08: 600 mg via ORAL
  Filled 2022-04-08: qty 1

## 2022-04-08 MED ORDER — OXYCODONE HCL 5 MG PO TABS: 5.0000 mg | ORAL_TABLET | Freq: Four times a day (QID) | ORAL | 0 refills | Status: DC | PRN

## 2022-04-08 MED ORDER — OXYCODONE HCL 5 MG PO TABS
5.0000 mg | ORAL_TABLET | Freq: Once | ORAL | Status: AC
  Administered 2022-04-08: 5 mg via ORAL
  Filled 2022-04-08: qty 1

## 2022-04-08 NOTE — ED Notes (Signed)
Pt in bed, pt has cam boot in place and reports that she has no questions about crutch use, pt states that she is ready to go home, pt verbalized understanding d/c and follow up, reviewed script, pt verbalized understanding.  Pt from dpt via wc.

## 2022-04-08 NOTE — ED Provider Notes (Signed)
[facility] EMERGENCY DEPARTMENT Provider Note   CSN: [id number] Arrival date & time: 04/08/22  9678     History  No chief complaint on file.   Kaylee White is a 51 y.o. female.  Patient presents with left foot pain.  Patient's foot fell asleep and stepped wrong causing her to feel a crack and sudden pain yesterday.  No history of diabetes.  No other injuries.  Pain with walking.  Mild swelling lateral foot.  No history of similar.       Home Medications Prior to Admission medications   Medication Sig Start Date End Date Taking? Authorizing Provider  oxyCODONE (ROXICODONE) 5 MG immediate release tablet Take 1 tablet (5 mg total) by mouth every 6 (six) hours as needed for severe pain. 04/08/22  Yes Blane Ohara, MD  cephALEXin (KEFLEX) 500 MG capsule Take 1 capsule (500 mg total) by mouth 2 (two) times daily. Patient not taking: Reported on 02/13/2022 11/17/20   Arvilla Market, MD  cyclobenzaprine (FLEXERIL) 10 MG tablet Take 1 tablet (10 mg total) by mouth 3 (three) times daily as needed for muscle spasms. Patient not taking: Reported on 02/13/2022 01/26/21   Rema Fendt, NP  FLUoxetine (PROZAC) 20 MG capsule Take 1 capsule by mouth daily. Patient not taking: Reported on 02/13/2022 09/24/17   [provider]  ibuprofen (ADVIL) 800 MG tablet Take 1 tablet (800 mg total) by mouth every 8 (eight) hours as needed for moderate pain. Patient not taking: Reported on 02/13/2022 03/17/21   Rema Fendt, NP  omeprazole (PRILOSEC) 40 MG capsule Take 1 capsule (40 mg total) by mouth daily. 01/26/21 04/26/21  Rema Fendt, NP      Allergies    Aspirin and Tylenol [acetaminophen]    Review of Systems   Review of Systems  Constitutional:  Negative for chills and fever.  HENT:  Negative for congestion.   Eyes:  Negative for visual disturbance.  Respiratory:  Negative for shortness of breath.   Cardiovascular:  Negative for chest pain.   Gastrointestinal:  Negative for abdominal pain and vomiting.  Genitourinary:  Negative for dysuria and flank pain.  Musculoskeletal:  Positive for gait problem and joint swelling. Negative for back pain, neck pain and neck stiffness.  Skin:  Negative for rash.  Neurological:  Negative for light-headedness and headaches.    Physical Exam Updated Vital Signs BP 135/86 (BP Location: Left Arm)   Pulse 83   Temp 98.8 F (37.1 C) (Oral)   Resp 18   LMP 05/30/2019 (LMP Unknown)   SpO2 100%  Physical Exam Vitals and nursing note reviewed.  Constitutional:      General: She is not in acute distress.    Appearance: She is well-developed.  HENT:     Head: Normocephalic and atraumatic.     Mouth/Throat:     Mouth: Mucous membranes are moist.  Eyes:     General:        Right eye: No discharge.        Left eye: No discharge.     Conjunctiva/sclera: Conjunctivae normal.  Neck:     Trachea: No tracheal deviation.  Cardiovascular:     Rate and Rhythm: Normal rate.  Pulmonary:     Effort: Pulmonary effort is normal.  Abdominal:     General: There is no distension.  Musculoskeletal:        General: Swelling, tenderness and signs of injury present. No deformity.  Cervical back: Normal range of motion and neck supple. No rigidity.     Comments: Patient has tenderness and swelling lateral proximal and mid foot on the left, compartments soft, no significant left leg tenderness.  Neurovascular intact left foot.  Skin:    General: Skin is warm.     Capillary Refill: Capillary refill takes less than 2 seconds.     Findings: No rash.  Neurological:     General: No focal deficit present.     Mental Status: She is alert.  Psychiatric:        Mood and Affect: Mood normal.     ED Results / Procedures / Treatments   Labs (all labs ordered are listed, but only abnormal results are displayed) Labs Reviewed  CBG MONITORING, ED - Abnormal; Notable for the following components:      Result  Value   Glucose-Capillary 108 (*)    All other components within normal limits    EKG None  Radiology DG Foot Complete Left  Result Date: 04/08/2022 CLINICAL DATA:  51 year old female with LEFT foot injury and pain for 1 day. Initial encounter. EXAM: LEFT FOOT - COMPLETE 3 VIEW COMPARISON:  None Available. FINDINGS: A nondisplaced fracture at the base of the 5th metatarsal is noted with equivocal extension to the cuboid-5th metatarsal joint. A small avulsion fracture of the distal LATERAL calcaneus is also noted. No other fracture, subluxation or dislocation identified. Mild soft tissue swelling is present. IMPRESSION: 1. Nondisplaced fracture at the base of the 5th metatarsal. 2. Small avulsion fracture of the distal LATERAL calcaneus. Electronically Signed   By: Harmon Pier M.D.   On: 04/08/2022 10:01   DG Foot Complete Right  Result Date: 04/08/2022 CLINICAL DATA:  Posttraumatic foot pain EXAM: RIGHT FOOT COMPLETE - 3+ VIEW COMPARISON:  None Available. FINDINGS: There is no evidence of fracture or dislocation. There is no evidence of arthropathy or other focal bone abnormality. Soft tissues are unremarkable. IMPRESSION: Negative. Electronically Signed   By: Tiburcio Pea M.D.   On: 04/08/2022 09:58    Procedures Procedures    Medications Ordered in ED Medications  oxyCODONE (Oxy IR/ROXICODONE) immediate release tablet 5 mg (has no administration in time range)  ibuprofen (ADVIL) tablet 600 mg (has no administration in time range)    ED Course/ Medical Decision Making/ A&P                           Medical Decision Making Amount and/or Complexity of Data Reviewed Radiology: ordered.  Risk Prescription drug management.   Patient presents with isolated left foot injury mechanical.  X-ray ordered and independently reviewed showing occult fracture without significant displacement.  No signs of open wounds or rashes.  Discussed with orthopedic technician and Ace wrap and walking  boot provided and outpatient follow-up discussed with orthopedics.  Patient comfortable this plan.  Pain meds given prior to discharge and prescription.        Final Clinical Impression(s) / ED Diagnoses Final diagnoses:  Nondisplaced fracture of fifth metatarsal bone, left foot, initial encounter for closed fracture  Contusion of left foot, initial encounter    Rx / DC Orders ED Discharge Orders          Ordered    oxyCODONE (ROXICODONE) 5 MG immediate release tablet  Every 6 hours PRN        04/08/22 1131              [place],  Ivin Booty, MD 04/08/22 1134

## 2022-04-08 NOTE — ED Triage Notes (Signed)
Patient reports that she twisted her foot yesterday and now pain and swelling to same, no other complaints

## 2022-04-08 NOTE — ED Notes (Signed)
Pt to er room number 10 via wc, pt states that she stepped wrong yesterday and felt a crack, pt has swelling and contusion to L foot, pt has strong pedal pulse, less than three sec cap refill, reports positive sensation to touch. Ortho tech at bedside .

## 2022-04-08 NOTE — Progress Notes (Signed)
Orthopedic Tech Progress Note Patient Details:  Kaylee White [birth year] [id number]  Ortho Devices Type of Ortho Device: CAM walker, Crutches Ortho Device/Splint Location: Left foot Ortho Device/Splint Interventions: Application   Post Interventions Patient Tolerated: Well, Ambulated well Instructions Provided: Poper ambulation with device, Adjustment of device  Jamee Pacholski E Mihir Flanigan 04/08/2022, 11:37 AM

## 2022-04-08 NOTE — Discharge Instructions (Addendum)
Follow-up closely with orthopedic surgery for further evaluation and management. Use walking boot as prescribed and minimize excessive use to support healing. Use Tylenol every 4 hours and ibuprofen every 6 hours needed for pain, you can take them both together every 6 hours for severe pain in addition to ice and elevation. For severe pain take roxicodon however realize they have the potential for addiction and it can make you sleepy and has tylenol in it.  No operating machinery while taking.

## 2022-04-10 ENCOUNTER — Encounter (HOSPITAL_COMMUNITY): Payer: Self-pay | Admitting: Emergency Medicine

## 2022-04-10 ENCOUNTER — Emergency Department (HOSPITAL_COMMUNITY)
Admission: EM | Admit: 2022-04-10 | Discharge: 2022-04-10 | Disposition: A | Discharge status: Home / Self Care | Payer: Medicaid Other | Attending: Student | Admitting: Student

## 2022-04-10 ENCOUNTER — Other Ambulatory Visit: Payer: Self-pay

## 2022-04-10 DIAGNOSIS — S99922A Unspecified injury of left foot, initial encounter: Secondary | ICD-10-CM | POA: Insufficient documentation

## 2022-04-10 DIAGNOSIS — X501XXA Overexertion from prolonged static or awkward postures, initial encounter: Secondary | ICD-10-CM | POA: Insufficient documentation

## 2022-04-10 MED ORDER — OXYCODONE HCL 5 MG PO TABS: 5.0000 mg | ORAL_TABLET | Freq: Four times a day (QID) | ORAL | 0 refills | Status: AC | PRN

## 2022-04-10 NOTE — Discharge Instructions (Signed)
Given a short course of narcotic medication please take as prescribed, I recommend my foot is not used to keep it elevated, please apply ice to the areas help decrease pain inflammation, you may take ibuprofen every 6 hours as needed.  Please follow-up with orthopedics for further evaluation.  Come back to the emergency department if you develop chest pain, shortness of breath, severe abdominal pain, uncontrolled nausea, vomiting, diarrhea.

## 2022-04-10 NOTE — ED Triage Notes (Signed)
Left foot pain related to recent fracture, does not have an ortho follow up until later in the week but does not have enough pain medication to last until then. Has been icing and using advil but is out of the oxycodone

## 2022-04-10 NOTE — ED Provider Notes (Signed)
[facility] EMERGENCY DEPARTMENT Provider Note   CSN: [id number] Arrival date & time: 04/10/22  2024     History  Chief Complaint  Patient presents with   Foot Injury    Kaylee White is a 51 y.o. female.  HPI   Presents with left foot pain, she twisted it 3 days ago, states she has pain along her left ankle and top of her foot, no paresthesias or weakness in her toes, she has Sowles formation of her toes, denies any recent trauma to the area, states that she came to the ER 2 days ago and was told that she had broke her foot in 2 places was placed in a boot and was given 2 days of pain medication.  States she is out of her pain meds and has been unable to control her pain.  She states she is try to follow-up with Ortho for further evaluation.  Reviewed patient's chart she has a nondisplaced fracture at the base of the fifth metatarsal as well as a small avulsion fracture of the distal lateral calcaneus, Ortho was consulted that time recommend Ace wrap as well as postop shoe nonweightbearing follow-up with Ortho.  She was only given 2 days of oxycodone.  Home Medications Prior to Admission medications   Medication Sig Start Date End Date Taking? Authorizing Provider  oxyCODONE (ROXICODONE) 5 MG immediate release tablet Take 1 tablet (5 mg total) by mouth every 6 (six) hours as needed for up to 2 days for severe pain. 04/10/22 04/12/22 Yes Carroll Sage, PA-C  cephALEXin (KEFLEX) 500 MG capsule Take 1 capsule (500 mg total) by mouth 2 (two) times daily. Patient not taking: Reported on 02/13/2022 11/17/20   Arvilla Market, MD  cyclobenzaprine (FLEXERIL) 10 MG tablet Take 1 tablet (10 mg total) by mouth 3 (three) times daily as needed for muscle spasms. Patient not taking: Reported on 02/13/2022 01/26/21   Rema Fendt, NP  FLUoxetine (PROZAC) 20 MG capsule Take 1 capsule by mouth daily. Patient not taking: Reported on 02/13/2022 09/24/17   [provider]  ibuprofen (ADVIL) 800 MG tablet Take 1 tablet (800 mg total) by mouth every 8 (eight) hours as needed for moderate pain. Patient not taking: Reported on 02/13/2022 03/17/21   Rema Fendt, NP  omeprazole (PRILOSEC) 40 MG capsule Take 1 capsule (40 mg total) by mouth daily. 01/26/21 04/26/21  Rema Fendt, NP      Allergies    Aspirin and Tylenol [acetaminophen]    Review of Systems   Review of Systems  Constitutional:  Negative for chills and fever.  Respiratory:  Negative for shortness of breath.   Cardiovascular:  Negative for chest pain.  Gastrointestinal:  Negative for abdominal pain.  Musculoskeletal:        Left foot pain  Neurological:  Negative for headaches.    Physical Exam Updated Vital Signs BP 138/83 (BP Location: Right Arm)   Pulse 71   Temp 98.2 F (36.8 C) (Oral)   Resp 18   Wt 72.6 kg   LMP 05/30/2019 (LMP Unknown)   SpO2 100%   BMI 31.25 kg/m  Physical Exam Vitals and nursing note reviewed.  Constitutional:      General: She is not in acute distress.    Appearance: She is not ill-appearing.  HENT:     Head: Normocephalic and atraumatic.     Nose: No congestion.  Eyes:     Conjunctiva/sclera: Conjunctivae normal.  Cardiovascular:  Rate and Rhythm: Normal rate and regular rhythm.     Pulses: Normal pulses.  Musculoskeletal:     Comments: Patient has a postop shoe on the left foot, it was removed, there was no edema or erythema present, no evidence of skin changes, she has 2+ dorsal pedal pulses, 2-second capillary refill, she has full range of motion of all her toes, limited range of motion at her ankle, she is tender on the dorsum of her foot.  Skin:    General: Skin is warm and dry.  Neurological:     Mental Status: She is alert.  Psychiatric:        Mood and Affect: Mood normal.     ED Results / Procedures / Treatments   Labs (all labs ordered are listed, but only abnormal results are displayed) Labs Reviewed - No data  to display  EKG None  Radiology No results found.  Procedures Procedures    Medications Ordered in ED Medications - No data to display  ED Course/ Medical Decision Making/ A&P                           Medical Decision Making  This patient presents to the ED for concern of foot pain, this involves an extensive number of treatment options, and is a complaint that carries with it a high risk of complications and morbidity.  The differential diagnosis includes compartment syndrome, ischemic foot, DVT    Additional history obtained:  Additional history obtained from N/A External records from outside source obtained and reviewed including previous ER note   Co morbidities that complicate the patient evaluation  N/A  Social Determinants of Health:  N/A    Lab Tests:  I Ordered, and personally interpreted labs.  The pertinent results include: N/A   Imaging Studies ordered:  I ordered imaging studies including N/A I independently visualized and interpreted imaging which showed N/A I agree with the radiologist interpretation   Cardiac Monitoring:  The patient was maintained on a cardiac monitor.  I personally viewed and interpreted the cardiac monitored which showed an underlying rhythm of: N/A   Medicines ordered and prescription drug management:  I ordered medication including N/A I have reviewed the patients home medicines and have made adjustments as needed  Critical Interventions:  N/A   Reevaluation:  Benign physical exam, agree on plan discharge at this time.  Consultations Obtained:  N/A   Test Considered:  N/A    Rule out Low suspicion for compartment syndrome as all compartments are soft, she has 2+ dorsal pedal pulses, 2-second capillary refill presentation is a 2 of etiology.  I have low suspicion for ischemic foot no evidence of ischemia present on exam no mottling of the skin, pulses fully intact.  I have low suspicion for DVT no  unilateral leg swelling no calf tenderness.    Dispostion and problem list  After consideration of the diagnostic results and the patients response to treatment, I feel that the patent would benefit from discharge.  Foot pain-likely acute pain from previous fractures, will provide with 2 days of narcotic medication, recommend continue with over-the-counter pain occasion follow-up with Ortho for further evaluation.            Final Clinical Impression(s) / ED Diagnoses Final diagnoses:  Injury of left foot, initial encounter    Rx / DC Orders ED Discharge Orders          Ordered  oxyCODONE (ROXICODONE) 5 MG immediate release tablet  Every 6 hours PRN        04/10/22 2341              Carroll Sage, PA-C 04/10/22 Ouida Sills    Dione Booze, MD 04/11/22 [phone]

## 2022-04-16 IMAGING — CR DG HAND COMPLETE 3+V*R*
3 series · 3 of 3 positions shown · non-contrast
Comparison: None.

CLINICAL DATA: Slammed right 4th finger in truck
door.laceration,pain pip joint right ring finger,unable to
straighten distal finger

EXAM:
RIGHT HAND - COMPLETE 3+ VIEW

[hand pa]
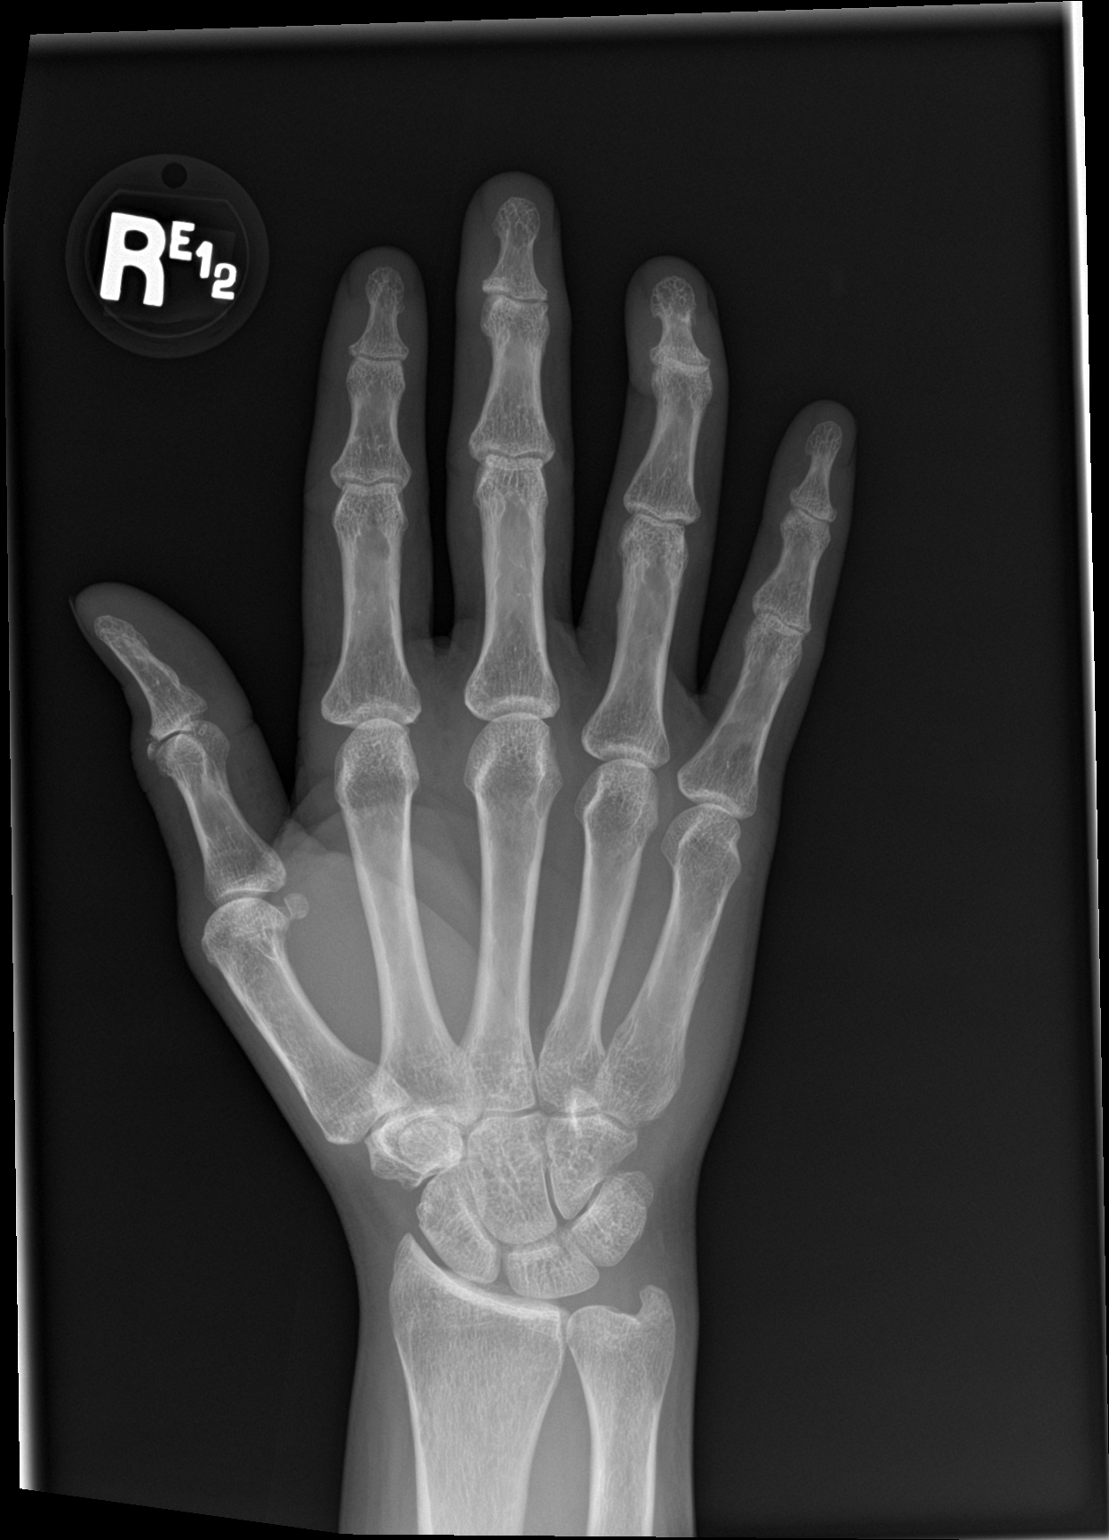

[hand obl]
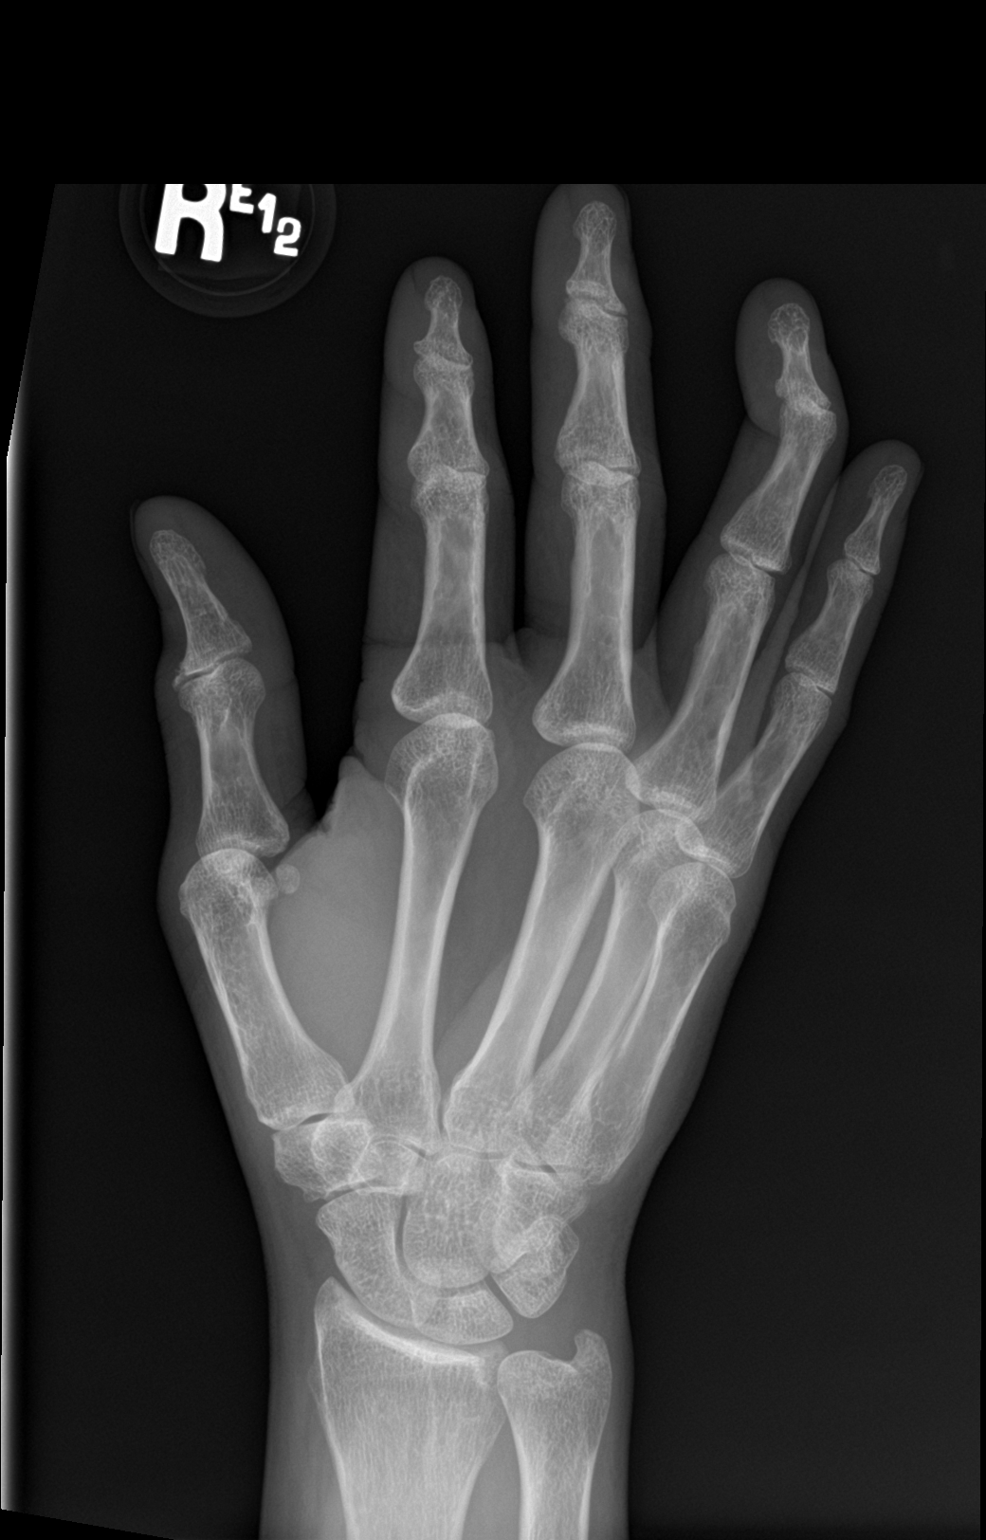

[hand lat]
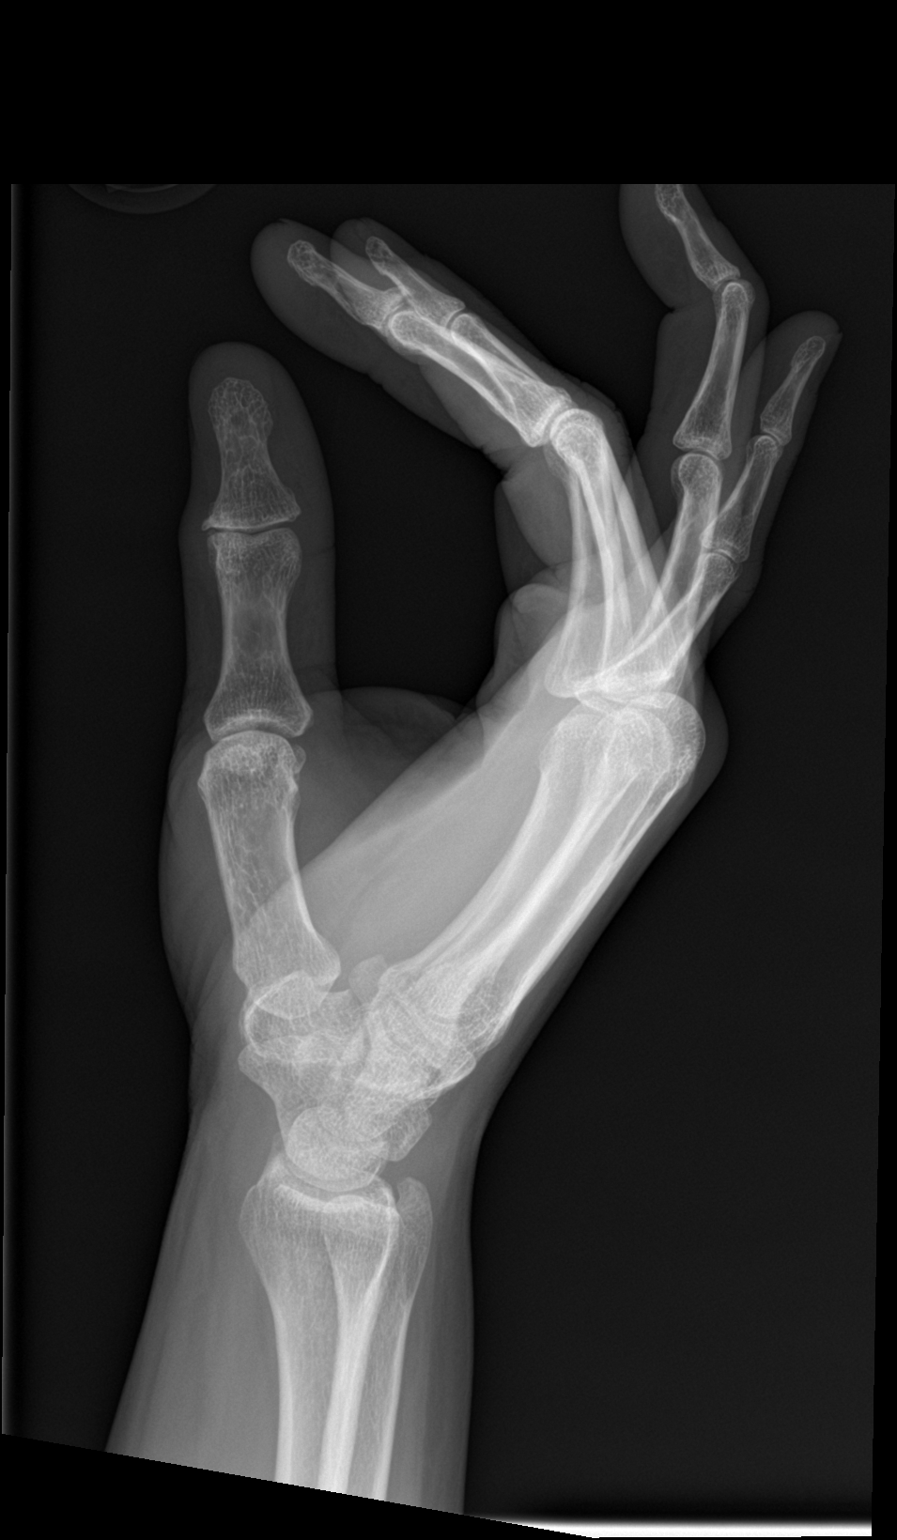

[3 of 3 positions shown; findings below may reference images not displayed]

FINDINGS: No fracture.  Joints are normally aligned.

DIP joint of the fourth finger is flexed.

Mild asymmetric joint space narrowing of the IP joint of the thumb
with small marginal osteophytes consistent with osteoarthritis. No
periarticular erosions.

Soft tissues are unremarkable.
IMPRESSION: 1. No fracture or dislocation.
2. Right fourth finger DIP joint flexion consistent with ligamentous
or tendon injury.

## 2022-08-31 ENCOUNTER — Emergency Department (HOSPITAL_COMMUNITY): Payer: Medicaid Other

## 2022-08-31 ENCOUNTER — Inpatient Hospital Stay (HOSPITAL_COMMUNITY)
Admission: EM | Admit: 2022-08-31 | Discharge: 2022-09-04 | DRG: 193 | Disposition: A | Discharge status: Home / Self Care | Payer: Medicaid Other | Attending: Internal Medicine | Admitting: Internal Medicine

## 2022-08-31 DIAGNOSIS — Z1152 Encounter for screening for COVID-19: Secondary | ICD-10-CM

## 2022-08-31 DIAGNOSIS — K219 Gastro-esophageal reflux disease without esophagitis: Secondary | ICD-10-CM | POA: Diagnosis present

## 2022-08-31 DIAGNOSIS — J9801 Acute bronchospasm: Secondary | ICD-10-CM | POA: Diagnosis present

## 2022-08-31 DIAGNOSIS — Z820 Family history of epilepsy and other diseases of the nervous system: Secondary | ICD-10-CM

## 2022-08-31 DIAGNOSIS — J209 Acute bronchitis, unspecified: Secondary | ICD-10-CM

## 2022-08-31 DIAGNOSIS — Z8616 Personal history of COVID-19: Secondary | ICD-10-CM

## 2022-08-31 DIAGNOSIS — J9601 Acute respiratory failure with hypoxia: Secondary | ICD-10-CM | POA: Diagnosis present

## 2022-08-31 DIAGNOSIS — Z886 Allergy status to analgesic agent status: Secondary | ICD-10-CM

## 2022-08-31 DIAGNOSIS — R0902 Hypoxemia: Secondary | ICD-10-CM

## 2022-08-31 DIAGNOSIS — J101 Influenza due to other identified influenza virus with other respiratory manifestations: Principal | ICD-10-CM | POA: Diagnosis present

## 2022-08-31 DIAGNOSIS — Z825 Family history of asthma and other chronic lower respiratory diseases: Secondary | ICD-10-CM

## 2022-08-31 DIAGNOSIS — Z79899 Other long term (current) drug therapy: Secondary | ICD-10-CM

## 2022-08-31 DIAGNOSIS — F17210 Nicotine dependence, cigarettes, uncomplicated: Secondary | ICD-10-CM | POA: Diagnosis present

## 2022-08-31 LAB — CBC WITH DIFFERENTIAL/PLATELET
Abs Immature Granulocytes: 0.08 10*3/uL — ABNORMAL HIGH (ref 0.00–0.07)
Basophils Absolute: 0 10*3/uL (ref 0.0–0.1)
Basophils Relative: 0 %
Eosinophils Absolute: 0 10*3/uL (ref 0.0–0.5)
Eosinophils Relative: 0 %
HCT: 40.5 % (ref 36.0–46.0)
Hemoglobin: 13.3 g/dL (ref 12.0–15.0)
Immature Granulocytes: 1 %
Lymphocytes Relative: 5 %
Lymphs Abs: 0.6 10*3/uL — ABNORMAL LOW (ref 0.7–4.0)
MCH: 30.2 pg (ref 26.0–34.0)
MCHC: 32.8 g/dL (ref 30.0–36.0)
MCV: 92 fL (ref 80.0–100.0)
Monocytes Absolute: 0.9 10*3/uL (ref 0.1–1.0)
Monocytes Relative: 7 %
Neutro Abs: 10.9 10*3/uL — ABNORMAL HIGH (ref 1.7–7.7)
Neutrophils Relative %: 87 %
Platelets: 324 10*3/uL (ref 150–400)
RBC: 4.4 MIL/uL (ref 3.87–5.11)
RDW: 12.4 % (ref 11.5–15.5)
WBC: 12.6 10*3/uL — ABNORMAL HIGH (ref 4.0–10.5)
nRBC: 0 % (ref 0.0–0.2)

## 2022-08-31 LAB — BASIC METABOLIC PANEL
Anion gap: 11 (ref 5–15)
BUN: 10 mg/dL (ref 6–20)
CO2: 24 mmol/L (ref 22–32)
Calcium: 8.9 mg/dL (ref 8.9–10.3)
Chloride: 101 mmol/L (ref 98–111)
Creatinine, Ser: 0.72 mg/dL (ref 0.44–1.00)
GFR, Estimated: >60 mL/min (ref >60)
Glucose, Bld: 127 mg/dL — ABNORMAL HIGH (ref 70–99)
Potassium: 3.8 mmol/L (ref 3.5–5.1)
Sodium: 136 mmol/L (ref 135–145)

## 2022-08-31 LAB — RESP PANEL BY RT-PCR (RSV, FLU A&B, COVID)  RVPGX2
Influenza A by PCR: POSITIVE — AB
Influenza B by PCR: NEGATIVE
Resp Syncytial Virus by PCR: NEGATIVE
SARS Coronavirus 2 by RT PCR: NEGATIVE

## 2022-08-31 LAB — BRAIN NATRIURETIC PEPTIDE: B Natriuretic Peptide: 49.6 pg/mL (ref 0.0–100.0)

## 2022-08-31 MED ORDER — GUAIFENESIN ER 600 MG PO TB12
600.0000 mg | ORAL_TABLET | Freq: Two times a day (BID) | ORAL | Status: DC
  Administered 2022-08-31 – 2022-09-04 (×8): 600 mg via ORAL
  Filled 2022-08-31 (×8): qty 1

## 2022-08-31 MED ORDER — ENOXAPARIN SODIUM 40 MG/0.4ML IJ SOSY
40.0000 mg | PREFILLED_SYRINGE | INTRAMUSCULAR | Status: DC
  Administered 2022-08-31 – 2022-09-03 (×4): 40 mg via SUBCUTANEOUS
  Filled 2022-08-31 (×4): qty 0.4

## 2022-08-31 MED ORDER — IPRATROPIUM-ALBUTEROL 0.5-2.5 (3) MG/3ML IN SOLN: 3.0000 mL | Freq: Once | RESPIRATORY_TRACT | Status: DC

## 2022-08-31 MED ORDER — IPRATROPIUM-ALBUTEROL 0.5-2.5 (3) MG/3ML IN SOLN
6.0000 mL | Freq: Once | RESPIRATORY_TRACT | Status: AC
  Administered 2022-08-31: 6 mL via RESPIRATORY_TRACT
  Filled 2022-08-31: qty 6

## 2022-08-31 MED ORDER — PREDNISONE 20 MG PO TABS
40.0000 mg | ORAL_TABLET | Freq: Every day | ORAL | Status: AC
  Administered 2022-09-01 – 2022-09-04 (×4): 40 mg via ORAL
  Filled 2022-08-31 (×4): qty 2

## 2022-08-31 MED ORDER — PREDNISONE 20 MG PO TABS
60.0000 mg | ORAL_TABLET | Freq: Once | ORAL | Status: AC
Start: 2022-08-31 — End: 2022-08-31
  Administered 2022-08-31: 60 mg via ORAL
  Filled 2022-08-31: qty 3

## 2022-08-31 MED ORDER — SENNOSIDES-DOCUSATE SODIUM 8.6-50 MG PO TABS: 1.0000 | ORAL_TABLET | Freq: Every evening | ORAL | Status: DC | PRN

## 2022-08-31 MED ORDER — ALBUTEROL SULFATE (2.5 MG/3ML) 0.083% IN NEBU
2.5000 mg | INHALATION_SOLUTION | RESPIRATORY_TRACT | Status: DC | PRN
  Administered 2022-09-02 (×2): 2.5 mg via RESPIRATORY_TRACT
  Filled 2022-08-31 (×2): qty 3

## 2022-08-31 MED ORDER — OSELTAMIVIR PHOSPHATE 75 MG PO CAPS
75.0000 mg | ORAL_CAPSULE | Freq: Two times a day (BID) | ORAL | Status: DC
  Administered 2022-09-01 – 2022-09-04 (×7): 75 mg via ORAL
  Filled 2022-08-31 (×8): qty 1

## 2022-08-31 MED ORDER — ONDANSETRON HCL 4 MG/2ML IJ SOLN: 4.0000 mg | Freq: Four times a day (QID) | INTRAMUSCULAR | Status: DC | PRN

## 2022-08-31 MED ORDER — IPRATROPIUM-ALBUTEROL 0.5-2.5 (3) MG/3ML IN SOLN
3.0000 mL | Freq: Once | RESPIRATORY_TRACT | Status: AC
  Administered 2022-08-31: 3 mL via RESPIRATORY_TRACT
  Filled 2022-08-31: qty 3

## 2022-08-31 MED ORDER — ONDANSETRON HCL 4 MG PO TABS: 4.0000 mg | ORAL_TABLET | Freq: Four times a day (QID) | ORAL | Status: DC | PRN

## 2022-08-31 MED ORDER — IPRATROPIUM-ALBUTEROL 0.5-2.5 (3) MG/3ML IN SOLN
6.0000 mL | Freq: Two times a day (BID) | RESPIRATORY_TRACT | Status: DC
  Administered 2022-08-31 – 2022-09-02 (×3): 6 mL via RESPIRATORY_TRACT
  Administered 2022-09-03: 3 mL via RESPIRATORY_TRACT
  Administered 2022-09-03 – 2022-09-04 (×2): 6 mL via RESPIRATORY_TRACT
  Filled 2022-08-31 (×7): qty 3

## 2022-08-31 MED ORDER — IBUPROFEN 400 MG PO TABS
400.0000 mg | ORAL_TABLET | Freq: Four times a day (QID) | ORAL | Status: DC | PRN
  Administered 2022-09-01: 400 mg via ORAL
  Filled 2022-08-31: qty 1

## 2022-08-31 MED ORDER — OSELTAMIVIR PHOSPHATE 75 MG PO CAPS
75.0000 mg | ORAL_CAPSULE | Freq: Once | ORAL | Status: AC
  Administered 2022-08-31: 75 mg via ORAL
  Filled 2022-08-31: qty 1

## 2022-08-31 MED ORDER — KETOROLAC TROMETHAMINE 15 MG/ML IJ SOLN
15.0000 mg | Freq: Once | INTRAMUSCULAR | Status: AC
  Administered 2022-08-31: 15 mg via INTRAVENOUS
  Filled 2022-08-31: qty 1

## 2022-08-31 MED ORDER — MAGNESIUM SULFATE 2 GM/50ML IV SOLN
2.0000 g | Freq: Once | INTRAVENOUS | Status: AC
  Administered 2022-08-31: 2 g via INTRAVENOUS
  Filled 2022-08-31: qty 50

## 2022-08-31 MED ORDER — LACTATED RINGERS IV BOLUS
1000.0000 mL | Freq: Once | INTRAVENOUS | Status: AC
  Administered 2022-08-31: 1000 mL via INTRAVENOUS

## 2022-08-31 NOTE — ED Triage Notes (Signed)
Patient BIB [facility] from home for evaluation of cough, shortness of breath, diarrhea and generalized body aches that started two days ago. Patient reports being around someone that was sick a few days ago. Room air SpO2 88% on room air, 95% on 2L O2 [place]. PAtient is alert, oriented, and in no apparent distress at this time.

## 2022-08-31 NOTE — ED Provider Notes (Signed)
[facility] EMERGENCY DEPARTMENT Provider Note   CSN: [id number] Arrival date & time: 08/31/22  1141     History  Chief Complaint  Patient presents with   Generalized Body Aches    Kaylee White is a 52 y.o. female.  HPI   Patient has history of depression who presents to the ED for evaluation of cough congestion shortness of breath.  Patient states she started having symptoms a couple days ago.  She has felt feverish.  She has had bodyaches.  She has been coughing and has been feeling short of breath.  Patient denies any history of lung problems such as asthma or COPD.  Patient's symptoms were worse today and she felt very short of breath so she came to the ED.  Home Medications Prior to Admission medications   Medication Sig Start Date End Date Taking? Authorizing Provider  cephALEXin (KEFLEX) 500 MG capsule Take 1 capsule (500 mg total) by mouth 2 (two) times daily. Patient not taking: Reported on 02/13/2022 11/17/20   Nicolette Bang, MD  cyclobenzaprine (FLEXERIL) 10 MG tablet Take 1 tablet (10 mg total) by mouth 3 (three) times daily as needed for muscle spasms. Patient not taking: Reported on 02/13/2022 01/26/21   Camillia Herter, NP  FLUoxetine (PROZAC) 20 MG capsule Take 1 capsule by mouth daily. Patient not taking: Reported on 02/13/2022 09/24/17   [provider]  ibuprofen (ADVIL) 800 MG tablet Take 1 tablet (800 mg total) by mouth every 8 (eight) hours as needed for moderate pain. Patient not taking: Reported on 02/13/2022 03/17/21   Camillia Herter, NP  omeprazole (PRILOSEC) 40 MG capsule Take 1 capsule (40 mg total) by mouth daily. 01/26/21 04/26/21  Camillia Herter, NP      Allergies    Aspirin and Tylenol [acetaminophen]    Review of Systems   Review of Systems  Physical Exam Updated Vital Signs BP (!) 136/92   Pulse 98   Temp 99.3 F (37.4 C)   Resp 18   LMP 05/30/2019 (LMP Unknown)   SpO2 93%  Physical Exam Vitals and  nursing note reviewed.  Constitutional:      General: She is not in acute distress.    Appearance: She is well-developed.  HENT:     Head: Normocephalic and atraumatic.     Right Ear: External ear normal.     Left Ear: External ear normal.  Eyes:     General: No scleral icterus.       Right eye: No discharge.        Left eye: No discharge.     Conjunctiva/sclera: Conjunctivae normal.  Neck:     Trachea: No tracheal deviation.  Cardiovascular:     Rate and Rhythm: Normal rate and regular rhythm.  Pulmonary:     Effort: Accessory muscle usage present. No respiratory distress.     Breath sounds: No stridor. Wheezing present. No rales.  Abdominal:     General: Bowel sounds are normal. There is no distension.     Palpations: Abdomen is soft.     Tenderness: There is no abdominal tenderness. There is no guarding or rebound.  Musculoskeletal:        General: No tenderness or deformity.     Cervical back: Neck supple.  Skin:    General: Skin is warm and dry.     Findings: No rash.  Neurological:     General: No focal deficit present.     Mental  Status: She is alert.     Cranial Nerves: No cranial nerve deficit, dysarthria or facial asymmetry.     Sensory: No sensory deficit.     Motor: No abnormal muscle tone or seizure activity.     Coordination: Coordination normal.  Psychiatric:        Mood and Affect: Mood normal.     ED Results / Procedures / Treatments   Labs (all labs ordered are listed, but only abnormal results are displayed) Labs Reviewed  RESP PANEL BY RT-PCR (RSV, FLU A&B, COVID)  RVPGX2 - Abnormal; Notable for the following components:      Result Value   Influenza A by PCR POSITIVE (*)    All other components within normal limits  BASIC METABOLIC PANEL - Abnormal; Notable for the following components:   Glucose, Bld 127 (*)    All other components within normal limits  CBC WITH DIFFERENTIAL/PLATELET - Abnormal; Notable for the following components:   WBC  12.6 (*)    Neutro Abs 10.9 (*)    Lymphs Abs 0.6 (*)    Abs Immature Granulocytes 0.08 (*)    All other components within normal limits  BRAIN NATRIURETIC PEPTIDE    EKG EKG Interpretation  Date/Time:  Thursday August 31 2022 11:45:46 EST Ventricular Rate:  98 PR Interval:  130 QRS Duration: 70 QT Interval:  334 QTC Calculation: 426 R Axis:   70 Text Interpretation: Normal sinus rhythm Nonspecific T wave abnormality Abnormal ECG When compared with ECG of 15-Apr-2019 14:35, non specific t wave changes noted Confirmed by Linwood Dibbles [phone]) on 08/31/2022 3:17:49 PM  Radiology DG Chest 2 View  Result Date: 08/31/2022 CLINICAL DATA:  Fever, chills and cough EXAM: CHEST - 2 VIEW COMPARISON:  Chest radiograph 04/15/2019 FINDINGS: The cardiomediastinal contours are within normal limits. The lungs are clear. No pneumothorax or pleural effusion. No acute finding in the visualized skeleton. IMPRESSION: No active cardiopulmonary disease. Electronically Signed   By: Emmaline Kluver M.D.   On: 08/31/2022 13:06    Procedures Procedures    Medications Ordered in ED Medications  ipratropium-albuterol (DUONEB) 0.5-2.5 (3) MG/3ML nebulizer solution 3 mL (has no administration in time range)  predniSONE (DELTASONE) tablet 60 mg (has no administration in time range)  magnesium sulfate IVPB 2 g 50 mL (has no administration in time range)    ED Course/ Medical Decision Making/ A&P Clinical Course as of 09/01/22 0706  Thu Aug 31, 2022  1515 CBC shows leukocytosis with a white count of 12.  Metabolic panel normal. [JK]  1515 BNP normal. [JK]  1515 Resp panel by RT-PCR (RSV, Flu A&B, Covid) Nasopharyngeal Swab(!) Patient is positive for influenza [JK]  1516 Next x-ray without acute abnormalities [JK]  1604 20 F smoker with respiratory sx, positive for Flu A. CXR with no pneumonia. Hypoxic 88 on RA, given Mg, duonebs, prednisone. Reassess off O2 [VB]  1628 Patient ambulated by nurse without  oxygen satting 86%.  She has received only 1 DuoNeb, will give 2 more treatments and reassess.  If still hypoxic plan for admission. [VB]  1735 Patient still desatting to 87% on room air on ambulation still requiring oxygen.  This is despite 3 times DuoNebs, magnesium, prednisone.  Reaching out for admission for continued management of reactive airway disease exacerbation in the setting of influenza A.  I reassessed breathing she has decreased wheezing however she still is mildly tachypneic and tachycardic to 118.  I have ordered for fluids Toradol and Tamiflu.  Reaching out to medicine for admission for hypoxia in the setting of flu A. [VB]  2023 Spoke with Dr. Posey Pronto who will admit patient for persistent hypoxia in the setting of influenza infection. [VB]    Clinical Course User Index [JK] Dorie Rank, MD [VB] Elgie Congo, MD                             Medical Decision Making Amount and/or Complexity of Data Reviewed Labs:  Decision-making details documented in ED Course.  Risk Prescription drug management. Decision regarding hospitalization.   Patient presented to the ED for evaluation of shortness of breath.  Patient had URI symptoms with cough and congestion.  Patient noted to have an oxygen requirement on arrival.  On my exam patient does have some evidence of bronchospasm.  Her ED workup is notable for acute influenza but no evidence of CHF exacerbation or pneumonia.  Will try a breathing treatment and steroids to see if there is some improvement.  If she continues to have an oxygen requirement, will require admission.  Case turned over to Dr Nechama Guard at shift change       Final Clinical Impression(s) / ED Diagnoses Final diagnoses:  Influenza A  Bronchitis with bronchospasm    Rx / DC Orders ED Discharge Orders     None         Dorie Rank, MD 09/01/22 [phone]

## 2022-08-31 NOTE — Hospital Course (Signed)
Kaylee White is a 52 y.o. female with medical history significant for GERD, tobacco use who is admitted with acute respiratory failure with hypoxia due to influenza A associated bronchitis/bronchospasm.

## 2022-08-31 NOTE — ED Provider Notes (Signed)
  Physical Exam  BP 138/87   Pulse (!) 115   Temp 99.3 F (37.4 C)   Resp (!) 25   LMP 05/30/2019 (LMP Unknown)   SpO2 97%   Physical Exam Constitutional:      Comments: Fatigued but nontoxic  Cardiovascular:     Rate and Rhythm: Tachycardia present.  Pulmonary:     Breath sounds: Normal breath sounds.     Comments: tachypneic Musculoskeletal:     Comments: No pitting edema  Neurological:     Mental Status: She is alert.     Procedures  .Critical Care  Performed by: Elgie Congo, MD Authorized by: Elgie Congo, MD   Critical care provider statement:    Critical care time (minutes):  30   Critical care was necessary to treat or prevent imminent or life-threatening deterioration of the following conditions:  Respiratory failure   Critical care was time spent personally by me on the following activities:  Development of treatment plan with patient or surrogate, discussions with consultants, evaluation of patient's response to treatment, examination of patient, ordering and review of laboratory studies, ordering and review of radiographic studies, ordering and performing treatments and interventions, pulse oximetry, re-evaluation of patient's condition, review of old charts and obtaining history from patient or surrogate   Care discussed with: admitting provider     ED Course / MDM   Clinical Course as of 08/31/22 2023  Thu Aug 31, 2022  1515 CBC shows leukocytosis with a white count of 12.  Metabolic panel normal. [JK]  1515 BNP normal. [JK]  1515 Resp panel by RT-PCR (RSV, Flu A&B, Covid) Nasopharyngeal Swab(!) Patient is positive for influenza [JK]  1516 Next x-ray without acute abnormalities [JK]  1604 4 F smoker with respiratory sx, positive for Flu A. CXR with no pneumonia. Hypoxic 88 on RA, given Mg, duonebs, prednisone. Reassess off O2 [VB]  4332 Patient ambulated by nurse without oxygen satting 86%.  She has received only 1 DuoNeb, will give 2 more  treatments and reassess.  If still hypoxic plan for admission. [VB]  1735 Patient still desatting to 87% on room air on ambulation still requiring oxygen.  This is despite 3 times DuoNebs, magnesium, prednisone.  Reaching out for admission for continued management of reactive airway disease exacerbation in the setting of influenza A.  I reassessed breathing she has decreased wheezing however she still is mildly tachypneic and tachycardic to 118.  I have ordered for fluids Toradol and Tamiflu.  Reaching out to medicine for admission for hypoxia in the setting of flu A. [VB]  2023 Spoke with Dr. Posey Pronto who will admit patient for persistent hypoxia in the setting of influenza infection. [VB]    Clinical Course User Index [JK] Dorie Rank, MD [VB] Elgie Congo, MD   Medical Decision Making Amount and/or Complexity of Data Reviewed Labs:  Decision-making details documented in ED Course.  Risk Prescription drug management. Decision regarding hospitalization.          Elgie Congo, MD 08/31/22 2023

## 2022-08-31 NOTE — ED Provider Triage Note (Signed)
Emergency Medicine Provider Triage Evaluation Note  Kaylee White , a 52 y.o. female  was evaluated in triage.  Pt complains of covid sxs. Report fever, chills, cough, congestion, body aches and sob x 2 days after covid exposure.  No hx of asthma or COPD.  Review of Systems  Positive: As above Negative: As above  Physical Exam  BP 132/86 (BP Location: Right Arm)   Pulse 100   Temp 99.5 F (37.5 C)   Resp 20   LMP 05/30/2019 (LMP Unknown)   SpO2 90%  Gen:   Awake, no distress   Resp:  Normal effort  MSK:   Moves extremities without difficulty  Other:    Medical Decision Making  Medically screening exam initiated at 12:08 PM.  Appropriate orders placed.  Teena L Disanti was informed that the remainder of the evaluation will be completed by another provider, this initial triage assessment does not replace that evaluation, and the importance of remaining in the ED until their evaluation is complete.  Low O2 of 88% on RA, improves with supplemental O2   Domenic Moras, PA-C 08/31/22 1210

## 2022-08-31 NOTE — H&P (Signed)
History and Physical    [name] UKG:[id number] DOB: 1971-03-25 DOA: 08/31/2022  PCP: [place], [place]  Patient coming from: Home  I have personally briefly reviewed patient's old medical records in [place]  Chief Complaint: Cough, dyspnea, body aches  HPI: Kaylee White is a 52 y.o. female with medical history significant for GERD, tobacco use who presented to the ED for evaluation of cough, dyspnea, body aches.  Patient states for the last 2 days she has been having cough productive of yellow sputum, dyspnea, generalized weakness, and bodyaches.  She has felt feverish and reports some diarrhea.  She denies any history of COPD or asthma.  She does smoke, states that 1 pack will last her 3-4 days.  ED Course  Labs/Imaging on admission: I have personally reviewed following labs and imaging studies.  Initial vitals showed BP 132/86, pulse 100, RR 20, temp 99.5 F, SpO2 98% on room air.  SpO2 dropped to 86% on room air with ambulation.  Patient placed on 2 O2 via .  Labs show WBC 12.6, hemoglobin 13.3, platelets 324,000, BNP 49.6, sodium 136, potassium 3.8, bicarb 24, BUN 10, creatinine 0.72, serum glucose 127.  Influenza A is positive.  COVID and RSV negative.  2 view chest x-ray negative for focal consolidation, edema, effusion.  Patient was given 1 L LR, IV magnesium 2 g, prednisone 60 mg, Toradol 15 mg, DuoNeb x 2, and Tamiflu.  Patient remained hypoxic with ambulation therefore the hospitalist service was consulted to admit for further evaluation and management.  Review of Systems: All systems reviewed and are negative except as documented in history of present illness above.   Past Medical History:  Diagnosis Date   Alcohol abuse    GERD (gastroesophageal reflux disease)    History of COVID-19 08/27/2020   MDD (major depressive disorder)    Substance abuse ([place])    Vitamin D deficiency     Past Surgical History:  Procedure Laterality  Date   CHOLECYSTECTOMY     MANDIBLE RECONSTRUCTION     MULTIPLE TOOTH EXTRACTIONS      Social History:  reports that she has been smoking cigarettes. She has been smoking an average of .25 packs per day. She has never used smokeless tobacco. She reports current alcohol use. She reports current drug use. Drugs: Cocaine and Marijuana.  Allergies  Allergen Reactions   Aspirin Itching and Nausea And Vomiting   Tylenol [Acetaminophen] Itching and Nausea And Vomiting    Family History  Problem Relation Age of Onset   COPD Mother    Asthma Father    Eczema Father    Epilepsy Maternal Aunt      Prior to Admission medications   Medication Sig Start Date End Date Taking? Authorizing Provider  cephALEXin (KEFLEX) 500 MG capsule Take 1 capsule (500 mg total) by mouth 2 (two) times daily. Patient not taking: Reported on 02/13/2022 11/17/20   Nicolette Bang, MD  cyclobenzaprine (FLEXERIL) 10 MG tablet Take 1 tablet (10 mg total) by mouth 3 (three) times daily as needed for muscle spasms. Patient not taking: Reported on 02/13/2022 01/26/21   Camillia Herter, NP  ibuprofen (ADVIL) 800 MG tablet Take 1 tablet (800 mg total) by mouth every 8 (eight) hours as needed for moderate pain. Patient not taking: Reported on 02/13/2022 03/17/21   Camillia Herter, NP  omeprazole (PRILOSEC) 40 MG capsule Take 1 capsule (40 mg total) by mouth daily. 01/26/21 04/26/21  Kaylee White  J, NP    Physical Exam: Vitals:   08/31/22 1845 08/31/22 1900 08/31/22 1940 08/31/22 1940  BP:   129/67   Pulse: (!) 102 (!) 101 (!) 102   Resp:   (!) 28   Temp:    97.9 F (36.6 C)  TempSrc:    Oral  SpO2: 97% 97% 96%    Constitutional: Resting in bed, NAD, calm, comfortable Eyes: EOMI, lids and conjunctivae normal ENMT: Mucous membranes are moist. Posterior pharynx clear of any exudate or lesions.Normal dentition.  Neck: normal, supple, no masses. Respiratory: Faint end expiratory wheezing upper lung fields. Normal  respiratory effort while on 2 L O2 via [place]. No accessory muscle use.  Cardiovascular: Regular rate and rhythm, no murmurs / rubs / gallops. No extremity edema. 2+ pedal pulses. Abdomen: no tenderness, no masses palpated. Musculoskeletal: no clubbing / cyanosis. No joint deformity upper and lower extremities. Good ROM, no contractures. Normal muscle tone.  Skin: no rashes, lesions, ulcers. No induration Neurologic: Sensation intact. Strength 5/5 in all 4.  Psychiatric: Alert and oriented x 3. Normal mood.   EKG: Personally reviewed. Sinus rhythm, rate 98, no acute ischemic changes.  Assessment/Plan Principal Problem:   Acute respiratory failure with hypoxia (HCC) Active Problems:   Influenza A   Martrice L White is a 52 y.o. female with medical history significant for GERD, tobacco use who is admitted with acute respiratory failure with hypoxia due to influenza A associated bronchitis/bronchospasm.  Assessment and Plan: Acute respiratory failure with hypoxia due to influenza A bronchitis/bronchospasm: Admitted with new hypoxia at rest and with ambulation on room air with SpO2 as low as 86% while ambulating.  Requiring 2 L O2 via [place] on admission.  CXR without evidence of pneumonia. -Continue DuoNeb BID with albuterol as needed -Prednisone 40 mg daily -Continue Tamiflu -Incentive spirometer, flutter valve, Mucinex -Continue supplemental O2 and wean as able  Tobacco use: Reports smoking about quarter pack per day.  She declines nicotine patch.  She is thinking about quitting and smoking cessation was advised.   DVT prophylaxis: enoxaparin (LOVENOX) injection 40 mg Start: 08/31/22 2200 Code Status: Full code, confirmed with patient on admission Family Communication: Discussed with patient, she has discussed with family Disposition Plan: From home and likely discharge to home in 1-2 days Consults called: None Severity of Illness: The appropriate patient status for this patient is  OBSERVATION. Observation status is judged to be reasonable and necessary in order to provide the required intensity of service to ensure the patient's safety. The patient's presenting symptoms, physical exam findings, and initial radiographic and laboratory data in the context of their medical condition is felt to place them at decreased risk for further clinical deterioration. Furthermore, it is anticipated that the patient will be medically stable for discharge from the hospital within 2 midnights of admission.   Zada Finders MD [facility]  If 7PM-7AM, please contact night-coverage [email or web address]  08/31/2022, 9:14 PM

## 2022-09-01 DIAGNOSIS — J9601 Acute respiratory failure with hypoxia: Secondary | ICD-10-CM | POA: Diagnosis not present

## 2022-09-01 LAB — BASIC METABOLIC PANEL
Anion gap: 15 (ref 5–15)
BUN: 9 mg/dL (ref 6–20)
CO2: 20 mmol/L — ABNORMAL LOW (ref 22–32)
Calcium: 8.9 mg/dL (ref 8.9–10.3)
Chloride: 102 mmol/L (ref 98–111)
Creatinine, Ser: 1.06 mg/dL — ABNORMAL HIGH (ref 0.44–1.00)
GFR, Estimated: >60 mL/min (ref >60)
Glucose, Bld: 241 mg/dL — ABNORMAL HIGH (ref 70–99)
Potassium: 3.3 mmol/L — ABNORMAL LOW (ref 3.5–5.1)
Sodium: 137 mmol/L (ref 135–145)

## 2022-09-01 LAB — CBC
HCT: 36.2 % (ref 36.0–46.0)
Hemoglobin: 11.6 g/dL — ABNORMAL LOW (ref 12.0–15.0)
MCH: 30.1 pg (ref 26.0–34.0)
MCHC: 32 g/dL (ref 30.0–36.0)
MCV: 93.8 fL (ref 80.0–100.0)
Platelets: 282 10*3/uL (ref 150–400)
RBC: 3.86 MIL/uL — ABNORMAL LOW (ref 3.87–5.11)
RDW: 12.5 % (ref 11.5–15.5)
WBC: 10.1 10*3/uL (ref 4.0–10.5)
nRBC: 0 % (ref 0.0–0.2)

## 2022-09-01 LAB — GLUCOSE, CAPILLARY: Glucose-Capillary: 140 mg/dL — ABNORMAL HIGH (ref 70–99)

## 2022-09-01 LAB — HIV ANTIBODY (ROUTINE TESTING W REFLEX): HIV Screen 4th Generation wRfx: NONREACTIVE

## 2022-09-01 MED ORDER — ACETAMINOPHEN 325 MG PO TABS: 650.0000 mg | ORAL_TABLET | Freq: Four times a day (QID) | ORAL | Status: DC | PRN

## 2022-09-01 MED ORDER — TRAMADOL HCL 50 MG PO TABS
50.0000 mg | ORAL_TABLET | Freq: Once | ORAL | Status: AC
  Administered 2022-09-01: 50 mg via ORAL
  Filled 2022-09-01: qty 1

## 2022-09-01 MED ORDER — BENZONATATE 100 MG PO CAPS
200.0000 mg | ORAL_CAPSULE | Freq: Three times a day (TID) | ORAL | Status: DC
  Administered 2022-09-01 – 2022-09-04 (×10): 200 mg via ORAL
  Filled 2022-09-01 (×10): qty 2

## 2022-09-01 MED ORDER — HYDROCOD POLI-CHLORPHE POLI ER 10-8 MG/5ML PO SUER
5.0000 mL | Freq: Two times a day (BID) | ORAL | Status: DC
  Administered 2022-09-01 – 2022-09-04 (×7): 5 mL via ORAL
  Filled 2022-09-01 (×7): qty 5

## 2022-09-01 MED ORDER — IBUPROFEN 400 MG PO TABS: 400.0000 mg | ORAL_TABLET | Freq: Four times a day (QID) | ORAL | Status: DC

## 2022-09-01 MED ORDER — PANTOPRAZOLE SODIUM 40 MG PO TBEC
40.0000 mg | DELAYED_RELEASE_TABLET | Freq: Two times a day (BID) | ORAL | Status: DC
  Administered 2022-09-01 – 2022-09-04 (×7): 40 mg via ORAL
  Filled 2022-09-01 (×7): qty 1

## 2022-09-01 MED ORDER — ACETAMINOPHEN 325 MG PO TABS
650.0000 mg | ORAL_TABLET | ORAL | Status: DC | PRN
  Administered 2022-09-01 – 2022-09-04 (×5): 650 mg via ORAL
  Filled 2022-09-01 (×5): qty 2

## 2022-09-01 NOTE — Progress Notes (Signed)
[facility] Progress Note  Patient: Kaylee White     ZOX:[id number]  DOA: 08/31/2022   PCP: [place]       Brief hospital course: 52 year old female with GERD and on and off tobacco abuse for years presents to the ED for cough dyspnea and generalized bodyaches.  In the ED she was found to have influenza A, noted to be wheezing, noted to be hypoxic with a pulse ox of 86%. She was given IV magnesium, 60 mg of prednisone, Tamiflu, DuoNebs x 2, Toradol and 1 L of lactated [place].  Subjective:  She is breathing just a little bit better today.  She has a great deal of cough.  Her body aches are little bit better as well.  She has a lot of congestion Assessment and Plan: Principal Problem:   Acute respiratory failure with hypoxia ([place])   Influenza A -She is improving with the current treatments - I have asked her to ambulate multiple times today, use the incentive spirometer and the flutter valve and continue to receive DuoNeb treatments - I have asked her to increase her fluid and oral intake - Hopefully can discharge tomorrow     Code Status: Full Code Consultants: None Level of Care: Level of care: Med-Surg Total time on patient care: 35 minutes DVT prophylaxis:  enoxaparin (LOVENOX) injection 40 mg Start: 08/31/22 2200     Objective:   Vitals:   09/01/22 0649 09/01/22 0814 09/01/22 1036 09/01/22 1622  BP: 117/81 122/77  121/73  Pulse: 90 87  94  Resp: 20 18  18   Temp: 98.5 F (36.9 C) 98.6 F (37 C)  98.8 F (37.1 C)  TempSrc: Oral Oral  Oral  SpO2: 98% 99% 98% 98%  Weight:      Height:       Filed Weights   08/31/22 2119  Weight: 73 kg   Exam: General exam: Appears comfortable  HEENT: oral mucosa moist Respiratory system: Bilateral wheezing and cough when taking a deep breath.  Cardiovascular system: S1 & S2 heard  Gastrointestinal system: Abdomen soft, non-tender, nondistended. Normal bowel sounds   Extremities: No cyanosis,  clubbing or edema Psychiatry:  Mood & affect appropriate.      CBC: Recent Labs  Lab 08/31/22 1215 09/01/22 0105  WBC 12.6* 10.1  NEUTROABS 10.9*  --   HGB 13.3 11.6*  HCT 40.5 36.2  MCV 92.0 93.8  PLT 324 811   Basic Metabolic Panel: Recent Labs  Lab 08/31/22 1215 09/01/22 0105  NA 136 137  K 3.8 3.3*  CL 101 102  CO2 24 20*  GLUCOSE 127* 241*  BUN 10 9  CREATININE 0.72 1.06*  CALCIUM 8.9 8.9   GFR: Estimated Creatinine Clearance: 56 mL/min (A) (by C-G formula based on SCr of 1.06 mg/dL (H)).  Scheduled Meds:  benzonatate  200 mg Oral TID   chlorpheniramine-HYDROcodone  5 mL Oral Q12H   enoxaparin (LOVENOX) injection  40 mg Subcutaneous Q24H   guaiFENesin  600 mg Oral BID   ipratropium-albuterol  6 mL Nebulization BID   oseltamivir  75 mg Oral BID   pantoprazole  40 mg Oral BID AC   predniSONE  40 mg Oral Q breakfast   Continuous Infusions: Imaging and lab data was personally reviewed DG Chest 2 View  Result Date: 08/31/2022 CLINICAL DATA:  Fever, chills and cough EXAM: CHEST - 2 VIEW COMPARISON:  Chest radiograph 04/15/2019 FINDINGS: The cardiomediastinal contours are within normal limits. The lungs are  clear. No pneumothorax or pleural effusion. No acute finding in the visualized skeleton. IMPRESSION: No active cardiopulmonary disease. Electronically Signed   By: Audie Pinto M.D.   On: 08/31/2022 13:06    LOS: 0 days   Author: Debbe Odea  09/01/2022 4:34 PM  To contact [facility]>   Check the care team in [facility] and look for the attending/consulting [place] provider listed  Log into [email or web address] and use [place]'s universal password   Go to> "[facility]"  and find provider  If you still have difficulty reaching the provider, please page the [facility] (Director on Call) for the Hospitalists listed on amion

## 2022-09-01 NOTE — ED Notes (Signed)
ED TO INPATIENT HANDOFF REPORT  ED Nurse Name and Phone #:  Bonna Gains [phone]  S Name/Age/Gender Kaylee White 52 y.o. female Room/Bed: 036C/036C  Code Status   Code Status: Full Code  Home/SNF/Other Home Patient oriented to: self, place, time, and situation Is this baseline? Yes   Triage Complete: Triage complete  Chief Complaint Acute respiratory failure with hypoxia ([place]) [J96.01]  Triage Note Patient BIB [facility] from home for evaluation of cough, shortness of breath, diarrhea and generalized body aches that started two days ago. Patient reports being around someone that was sick a few days ago. Room air SpO2 88% on room air, 95% on 2L O2 [place]. PAtient is alert, oriented, and in no apparent distress at this time.   Allergies Allergies  Allergen Reactions   Aspirin Itching and Nausea And Vomiting   Tylenol [Acetaminophen] Itching and Nausea And Vomiting    Level of Care/Admitting Diagnosis ED Disposition     ED Disposition  Admit   Condition  --   Comment  Hospital Area: [place] [[id number]]  Level of Care: Med-Surg [16]  May place patient in observation at [facility] or [place] if equivalent level of care is available:: No  Covid Evaluation: Confirmed COVID Negative  Diagnosis: Acute respiratory failure with hypoxia [facility]) [[id number]]  Admitting Physician: Lenore Cordia [[id number]]  Attending Physician: Lenore Cordia [[id number]]          B Medical/Surgery History Past Medical History:  Diagnosis Date   Alcohol abuse    GERD (gastroesophageal reflux disease)    History of COVID-19 08/27/2020   MDD (major depressive disorder)    Substance abuse ([place])    Vitamin D deficiency    Past Surgical History:  Procedure Laterality Date   CHOLECYSTECTOMY     MANDIBLE RECONSTRUCTION     MULTIPLE TOOTH EXTRACTIONS       A IV Location/Drains/Wounds Patient Lines/Drains/Airways Status     Active Line/Drains/Airways     Name  Placement date Placement time Site Days   Peripheral IV 08/31/22 24 G Left;Posterior Hand 08/31/22  1554  Hand  1            Intake/Output Last 24 hours  Intake/Output Summary (Last 24 hours) at 09/01/2022 0527 Last data filed at 08/31/2022 2132 Gross per 24 hour  Intake 1000 ml  Output --  Net 1000 ml    Labs/Imaging Results for orders placed or performed during the hospital encounter of 08/31/22 (from the past 48 hour(s))  Resp panel by RT-PCR (RSV, Flu A&B, Covid) Nasopharyngeal Swab     Status: Abnormal   Collection Time: 08/31/22 11:59 AM   Specimen: Nasopharyngeal Swab; Nasal Swab  Result Value Ref Range   SARS Coronavirus 2 by RT PCR NEGATIVE NEGATIVE    Comment: (NOTE) SARS-CoV-2 target nucleic acids are NOT DETECTED.  The SARS-CoV-2 RNA is generally detectable in upper respiratory specimens during the acute phase of infection. The lowest concentration of SARS-CoV-2 viral copies this assay can detect is 138 copies/mL. A negative result does not preclude SARS-Cov-2 infection and should not be used as the sole basis for treatment or other patient management decisions. A negative result may occur with  improper specimen collection/handling, submission of specimen other than nasopharyngeal swab, presence of viral mutation(s) within the areas targeted by this assay, and inadequate number of viral copies(<138 copies/mL). A negative result must be combined with clinical observations, patient history, and epidemiological information. The expected result is Negative.  Fact Sheet for Patients:  BloggerCourse.com  Fact Sheet for Healthcare Providers:  SeriousBroker.it  This test is no t yet approved or cleared by the [place] FDA and  has been authorized for detection and/or diagnosis of SARS-CoV-2 by FDA under an Emergency Use Authorization (EUA). This EUA will remain  in effect (meaning this test can be used) for  the duration of the COVID-19 declaration under Section 564(b)(1) of the Act, 21 U.S.C.section 360bbb-3(b)(1), unless the authorization is terminated  or revoked sooner.       Influenza A by PCR POSITIVE (A) NEGATIVE   Influenza B by PCR NEGATIVE NEGATIVE    Comment: (NOTE) The Xpert Xpress SARS-CoV-2/FLU/RSV plus assay is intended as an aid in the diagnosis of influenza from Nasopharyngeal swab specimens and should not be used as a sole basis for treatment. Nasal washings and aspirates are unacceptable for Xpert Xpress SARS-CoV-2/FLU/RSV testing.  Fact Sheet for Patients: BloggerCourse.com  Fact Sheet for Healthcare Providers: SeriousBroker.it  This test is not yet approved or cleared by the [place] FDA and has been authorized for detection and/or diagnosis of SARS-CoV-2 by FDA under an Emergency Use Authorization (EUA). This EUA will remain in effect (meaning this test can be used) for the duration of the COVID-19 declaration under Section 564(b)(1) of the Act, 21 U.S.C. section 360bbb-3(b)(1), unless the authorization is terminated or revoked.     Resp Syncytial Virus by PCR NEGATIVE NEGATIVE    Comment: (NOTE) Fact Sheet for Patients: BloggerCourse.com  Fact Sheet for Healthcare Providers: SeriousBroker.it  This test is not yet approved or cleared by the [place] FDA and has been authorized for detection and/or diagnosis of SARS-CoV-2 by FDA under an Emergency Use Authorization (EUA). This EUA will remain in effect (meaning this test can be used) for the duration of the COVID-19 declaration under Section 564(b)(1) of the Act, 21 U.S.C. section 360bbb-3(b)(1), unless the authorization is terminated or revoked.  Performed at [facility], [address] [place], [place] [address]   Basic metabolic panel     Status: Abnormal   Collection Time: 08/31/22  12:15 PM  Result Value Ref Range   Sodium 136 135 - 145 mmol/L   Potassium 3.8 3.5 - 5.1 mmol/L   Chloride 101 98 - 111 mmol/L   CO2 24 22 - 32 mmol/L   Glucose, Bld 127 (H) 70 - 99 mg/dL    Comment: Glucose reference range applies only to samples taken after fasting for at least 8 hours.   BUN 10 6 - 20 mg/dL   Creatinine, Ser 3.71 0.44 - 1.00 mg/dL   Calcium 8.9 8.9 - 69.6 mg/dL   GFR, Estimated >78 >93 mL/min    Comment: (NOTE) Calculated using the CKD-EPI Creatinine Equation (2021)    Anion gap 11 5 - 15    Comment: Performed at [facility], [address] [place], [place] [address]  CBC with Differential     Status: Abnormal   Collection Time: 08/31/22 12:15 PM  Result Value Ref Range   WBC 12.6 (H) 4.0 - 10.5 K/uL   RBC 4.40 3.87 - 5.11 MIL/uL   Hemoglobin 13.3 12.0 - 15.0 g/dL   HCT 51.0 25.8 - 52.7 %   MCV 92.0 80.0 - 100.0 fL   MCH 30.2 26.0 - 34.0 pg   MCHC 32.8 30.0 - 36.0 g/dL   RDW 78.2 42.3 - 53.6 %   Platelets 324 150 - 400 K/uL   nRBC 0.0  0.0 - 0.2 %   Neutrophils Relative % 87 %   Neutro Abs 10.9 (H) 1.7 - 7.7 K/uL   Lymphocytes Relative 5 %   Lymphs Abs 0.6 (L) 0.7 - 4.0 K/uL   Monocytes Relative 7 %   Monocytes Absolute 0.9 0.1 - 1.0 K/uL   Eosinophils Relative 0 %   Eosinophils Absolute 0.0 0.0 - 0.5 K/uL   Basophils Relative 0 %   Basophils Absolute 0.0 0.0 - 0.1 K/uL   Immature Granulocytes 1 %   Abs Immature Granulocytes 0.08 (H) 0.00 - 0.07 K/uL    Comment: Performed at [facility] [address], [place], [place] [address]  Brain natriuretic peptide     Status: None   Collection Time: 08/31/22 12:15 PM  Result Value Ref Range   B Natriuretic Peptide 49.6 0.0 - 100.0 pg/mL    Comment: Performed at [facility] [address], [place], [place] [address]  HIV Antibody (routine testing w rflx)     Status: None   Collection Time: 09/01/22  1:05 AM  Result Value Ref Range   HIV Screen 4th Generation wRfx Non Reactive Non  Reactive    Comment: Performed at [facility], [place] [address], [place], [place] [address]  CBC     Status: Abnormal   Collection Time: 09/01/22  1:05 AM  Result Value Ref Range   WBC 10.1 4.0 - 10.5 K/uL   RBC 3.86 (L) 3.87 - 5.11 MIL/uL   Hemoglobin 11.6 (L) 12.0 - 15.0 g/dL   HCT 36.2 36.0 - 46.0 %   MCV 93.8 80.0 - 100.0 fL   MCH 30.1 26.0 - 34.0 pg   MCHC 32.0 30.0 - 36.0 g/dL   RDW 12.5 11.5 - 15.5 %   Platelets 282 150 - 400 K/uL   nRBC 0.0 0.0 - 0.2 %    Comment: Performed at [facility], [place] [address], [place], [place] [address]  Basic metabolic panel     Status: Abnormal   Collection Time: 09/01/22  1:05 AM  Result Value Ref Range   Sodium 137 135 - 145 mmol/L   Potassium 3.3 (L) 3.5 - 5.1 mmol/L   Chloride 102 98 - 111 mmol/L   CO2 20 (L) 22 - 32 mmol/L   Glucose, Bld 241 (H) 70 - 99 mg/dL    Comment: Glucose reference range applies only to samples taken after fasting for at least 8 hours.   BUN 9 6 - 20 mg/dL   Creatinine, Ser 1.06 (H) 0.44 - 1.00 mg/dL   Calcium 8.9 8.9 - 10.3 mg/dL   GFR, Estimated >60 >60 mL/min    Comment: (NOTE) Calculated using the CKD-EPI Creatinine Equation (2021)    Anion gap 15 5 - 15    Comment: Performed at [facility] [address] [place], [place] [address]   DG Chest 2 View  Result Date: 08/31/2022 CLINICAL DATA:  Fever, chills and cough EXAM: CHEST - 2 VIEW COMPARISON:  Chest radiograph 04/15/2019 FINDINGS: The cardiomediastinal contours are within normal limits. The lungs are clear. No pneumothorax or pleural effusion. No acute finding in the visualized skeleton. IMPRESSION: No active cardiopulmonary disease. Electronically Signed   By: Audie Pinto M.D.   On: 08/31/2022 13:06    Pending Labs Unresulted Labs (From admission, onward)    None       Vitals/Pain Today's Vitals   09/01/22 0007 09/01/22 0022 09/01/22 0230 09/01/22 3329  BP:  134/76 123/76   Pulse:  (!) 107 (!) 108   Resp:  (!) 22  (!) 22   Temp: 97.9 F (36.6 C)   98.1 F (36.7 C)  TempSrc: Oral   Oral  SpO2:  97% 97%   Weight:      Height:      PainSc:        Isolation Precautions No active isolations  Medications Medications  oseltamivir (TAMIFLU) capsule 75 mg (has no administration in time range)  enoxaparin (LOVENOX) injection 40 mg (40 mg Subcutaneous Given 08/31/22 2137)  ondansetron (ZOFRAN) tablet 4 mg (has no administration in time range)    Or  ondansetron (ZOFRAN) injection 4 mg (has no administration in time range)  senna-docusate (Senokot-S) tablet 1 tablet (has no administration in time range)  predniSONE (DELTASONE) tablet 40 mg (has no administration in time range)  ipratropium-albuterol (DUONEB) 0.5-2.5 (3) MG/3ML nebulizer solution 6 mL (6 mLs Nebulization Given 08/31/22 2136)  albuterol (PROVENTIL) (2.5 MG/3ML) 0.083% nebulizer solution 2.5 mg (has no administration in time range)  guaiFENesin (MUCINEX) 12 hr tablet 600 mg (600 mg Oral Given 08/31/22 2132)  ibuprofen (ADVIL) tablet 400 mg (400 mg Oral Given 09/01/22 0304)  ipratropium-albuterol (DUONEB) 0.5-2.5 (3) MG/3ML nebulizer solution 3 mL (3 mLs Nebulization Given 08/31/22 1527)  predniSONE (DELTASONE) tablet 60 mg (60 mg Oral Given 08/31/22 1527)  magnesium sulfate IVPB 2 g 50 mL (0 g Intravenous Stopped 08/31/22 1737)  ipratropium-albuterol (DUONEB) 0.5-2.5 (3) MG/3ML nebulizer solution 6 mL (6 mLs Nebulization Given 08/31/22 1638)  lactated ringers bolus 1,000 mL (0 mLs Intravenous Stopped 08/31/22 2132)  ketorolac (TORADOL) 15 MG/ML injection 15 mg (15 mg Intravenous Given 08/31/22 1756)  oseltamivir (TAMIFLU) capsule 75 mg (75 mg Oral Given 08/31/22 1756)    Mobility walks     Focused Assessments Pulmonary Assessment Handoff:  Lung sounds: Bilateral Breath Sounds: Diminished O2 Device: Nasal Cannula O2 Flow Rate (L/min): 3 L/min    R Recommendations: See Admitting Provider Note  Report given to:   Additional Notes: Pt  a/o x 4, hypoxic without O2 and ambulation

## 2022-09-01 NOTE — Progress Notes (Signed)
Patient transfer from ED,alert and oriented,no complain of pain,patient  assessment done,vitals signs obtained,patient made comfortable in bed,call light in reach,will continue to monitor.

## 2022-09-01 NOTE — Progress Notes (Signed)
SATURATION QUALIFICATIONS: (This note is used to comply with regulatory documentation for home oxygen) ? ?Patient Saturations on Room Air at Rest = 98% ? ?Patient Saturations on Room Air while Ambulating = 98% ? ?Patient Saturations on 0 Liters of oxygen while Ambulating = n/a% ? ?Please briefly explain why patient needs home oxygen: ?

## 2022-09-01 NOTE — Progress Notes (Signed)
Gave flutter value to patient and instructed her how to use.  Walked patient in the hall about 200 feet.

## 2022-09-01 NOTE — Plan of Care (Signed)
  Problem: Education: Goal: Knowledge of General Education information will improve Description: Including pain rating scale, medication(s)/side effects and non-pharmacologic comfort measures Outcome: Progressing   Problem: Health Behavior/Discharge Planning: Goal: Ability to manage health-related needs will improve Outcome: Progressing

## 2022-09-02 DIAGNOSIS — K219 Gastro-esophageal reflux disease without esophagitis: Secondary | ICD-10-CM | POA: Diagnosis present

## 2022-09-02 DIAGNOSIS — Z1152 Encounter for screening for COVID-19: Secondary | ICD-10-CM | POA: Diagnosis not present

## 2022-09-02 DIAGNOSIS — Z79899 Other long term (current) drug therapy: Secondary | ICD-10-CM | POA: Diagnosis not present

## 2022-09-02 DIAGNOSIS — Z825 Family history of asthma and other chronic lower respiratory diseases: Secondary | ICD-10-CM | POA: Diagnosis not present

## 2022-09-02 DIAGNOSIS — J101 Influenza due to other identified influenza virus with other respiratory manifestations: Secondary | ICD-10-CM | POA: Diagnosis present

## 2022-09-02 DIAGNOSIS — J9601 Acute respiratory failure with hypoxia: Secondary | ICD-10-CM | POA: Diagnosis present

## 2022-09-02 DIAGNOSIS — Z886 Allergy status to analgesic agent status: Secondary | ICD-10-CM | POA: Diagnosis not present

## 2022-09-02 DIAGNOSIS — Z820 Family history of epilepsy and other diseases of the nervous system: Secondary | ICD-10-CM | POA: Diagnosis not present

## 2022-09-02 DIAGNOSIS — Z8616 Personal history of COVID-19: Secondary | ICD-10-CM | POA: Diagnosis not present

## 2022-09-02 DIAGNOSIS — J9801 Acute bronchospasm: Secondary | ICD-10-CM | POA: Diagnosis present

## 2022-09-02 DIAGNOSIS — F17210 Nicotine dependence, cigarettes, uncomplicated: Secondary | ICD-10-CM | POA: Diagnosis present

## 2022-09-02 LAB — GLUCOSE, CAPILLARY: Glucose-Capillary: 99 mg/dL (ref 70–99)

## 2022-09-02 NOTE — Progress Notes (Signed)
  Patient ambulated again in hallways and tolerated well without oxygen. Patient did not have shortness of breath or coughing.  Patient saturations while ambulating 95-96% on room air.  Patient oxygen saturations taken while resting on room air is 96%

## 2022-09-02 NOTE — Progress Notes (Signed)
[facility] Progress Note  Patient: Kaylee White     UKG:[id number]  DOA: 08/31/2022   PCP: [place]       Brief hospital course: 52 year old female with GERD and on and off tobacco abuse for years presents to the ED for cough dyspnea and generalized bodyaches.  In the ED she was found to have influenza A, noted to be wheezing, noted to be hypoxic with a pulse ox of 86%. She was given IV magnesium, 60 mg of prednisone, Tamiflu, DuoNebs x 2, Toradol and 1 L of lactated Ringer.  Subjective:  She is wheezing and coughing and states going to the bathroom makes her short of breath.  Assessment and Plan: Principal Problem:   Acute respiratory failure with hypoxia ([place])   Influenza A - She is improving with the current treatments - I have asked her to ambulate multiple times today, use the incentive spirometer and the flutter valve and continue to receive DuoNeb treatments - She has told the RN today that she does not have any money to pay for medication that I would prescribe for her today- she will not have money until Monday and therefore I am unable to dc her until Monday- she may or may not need a nebulizer machine on Monday when she is discharged dependent on how much she improves     Code Status: Full Code Consultants: None Level of Care: Level of care: Med-Surg Total time on patient care: 35 minutes DVT prophylaxis:  enoxaparin (LOVENOX) injection 40 mg Start: 08/31/22 2200     Objective:   Vitals:   09/02/22 0511 09/02/22 0915 09/02/22 1131 09/02/22 1516  BP: 98/64 100/61  116/72  Pulse: (!) 56 71  73  Resp:  17  15  Temp: 98.3 F (36.8 C) 98.3 F (36.8 C)  (!) 97.3 F (36.3 C)  TempSrc: Oral Oral  Oral  SpO2: 99% 98% 97% 100%  Weight:      Height:       Filed Weights   08/31/22 2119  Weight: 73 kg   Exam: General exam: Appears comfortable  HEENT: oral mucosa moist Respiratory system: b/l wheezing and cough Cardiovascular  system: S1 & S2 heard  Gastrointestinal system: Abdomen soft, non-tender, nondistended. Normal bowel sounds   Extremities: No cyanosis, clubbing or edema Psychiatry:  Mood & affect appropriate.       CBC: Recent Labs  Lab 08/31/22 1215 09/01/22 0105  WBC 12.6* 10.1  NEUTROABS 10.9*  --   HGB 13.3 11.6*  HCT 40.5 36.2  MCV 92.0 93.8  PLT 324 762    Basic Metabolic Panel: Recent Labs  Lab 08/31/22 1215 09/01/22 0105  NA 136 137  K 3.8 3.3*  CL 101 102  CO2 24 20*  GLUCOSE 127* 241*  BUN 10 9  CREATININE 0.72 1.06*  CALCIUM 8.9 8.9    GFR: Estimated Creatinine Clearance: 56 mL/min (A) (by C-G formula based on SCr of 1.06 mg/dL (H)).  Scheduled Meds:  benzonatate  200 mg Oral TID   chlorpheniramine-HYDROcodone  5 mL Oral Q12H   enoxaparin (LOVENOX) injection  40 mg Subcutaneous Q24H   guaiFENesin  600 mg Oral BID   ipratropium-albuterol  6 mL Nebulization BID   oseltamivir  75 mg Oral BID   pantoprazole  40 mg Oral BID AC   predniSONE  40 mg Oral Q breakfast   Continuous Infusions: Imaging and lab data was personally reviewed No results found.  LOS: 0 days  Author: Debbe Odea  09/02/2022 3:46 PM  To contact [facility]>   Check the care team in [facility] and look for the attending/consulting [facility] provider listed  Log into [email or web address] and use [place]'s universal password   Go to> "[facility]"  and find provider  If you still have difficulty reaching the provider, please page the [facility] (Director on Call) for the Hospitalists listed on amion

## 2022-09-02 NOTE — TOC Initial Note (Addendum)
Transition of Care [facility]) - Initial/Assessment Note    Patient Details  Name: Kaylee White MRN: [id number] Date of Birth: 1971-05-21  [facility] CM/SW Contact:    Bartholomew Crews, RN Phone Number: [phone] 09/02/2022, 2:28 PM  Clinical Narrative:                  UPDATEDamaris Schooner with MD - nebulizer not needed at this time. Patient may be able to transition home with inhalers on Monday.   Spoke with patient at the bedside to discuss post acute transition. Patient unable to pick up discharge medications until Monday when she is able to pick up her check stating the office isn't open until Monday. Patient stated that [facility] will not bill her. Patient agreeable to DME nebulizer - no preference with provider, referral to [facility] for delivery to the room. Patient stated that she does have a car for transportation, and that she has transportation home at discharge. TOC following for transition needs.   Expected Discharge Plan: Home/Self Care Barriers to Discharge: Continued Medical Work up   Patient Goals and CMS Choice Patient states their goals for this hospitalization and ongoing recovery are:: return home CMS Medicare.gov Compare Post Acute Care list provided to:: Patient Choice offered to / list presented to : Patient      Expected Discharge Plan and Services   Discharge Planning Services: CM Consult Post Acute Care Choice: Durable Medical Equipment Living arrangements for the past 2 months: Apartment                 DME Arranged: Nebulizer machine DME Agency: [employer] Date DME Agency Contacted: 09/02/22 Time DME Agency Contacted: [id number] Representative spoke with at DME Agency: Mardene Celeste HH Arranged: NA [place] Agency: NA        Prior Living Arrangements/Services Living arrangements for the past 2 months: [place] with:: Self Patient language and need for interpreter reviewed:: Yes Do you feel safe going back to the place where you live?: Yes       Need for Family Participation in Patient Care: No (Comment)     Criminal Activity/Legal Involvement Pertinent to Current Situation/Hospitalization: No - Comment as needed  Activities of Daily Living      Permission Sought/Granted                  Emotional Assessment Appearance:: Appears stated age Attitude/Demeanor/Rapport: Engaged Affect (typically observed): Accepting Orientation: : Oriented to Self, Oriented to Place, Oriented to  Time, Oriented to Situation Alcohol / Substance Use: Not Applicable Psych Involvement: No (comment)  Admission diagnosis:  Influenza A [J10.1] Hypoxia [R09.02] Bronchitis with bronchospasm [J20.9] Acute respiratory failure with hypoxia ([facility]) [J96.01] Patient Active Problem List   Diagnosis Date Noted   Acute respiratory failure with hypoxia ([place]) 08/31/2022   Influenza A 08/31/2022   Prediabetes 06/16/2021   Bacterial vaginitis 01/27/2021   PCP:  [place], [place] Pharmacy[id number] [place], [place] [address] [place] [address] Phone: [phone] Fax: [phone]     Social Determinants of Health (SDOH) Social History: SDOH Screenings   Depression (PHQ2-9): Low Risk  (01/26/2021)  Tobacco Use: High Risk (04/10/2022)   SDOH Interventions:     Readmission Risk Interventions     No data to display

## 2022-09-02 NOTE — Progress Notes (Signed)
Patient notified this nurse that she wanted to stay in the hospital until Monday. Patient stated she didn't have the finances to pay for her medications currently until Monday.  Notified MD of barriers to discharge.

## 2022-09-03 DIAGNOSIS — J9601 Acute respiratory failure with hypoxia: Secondary | ICD-10-CM | POA: Diagnosis not present

## 2022-09-03 LAB — BASIC METABOLIC PANEL
Anion gap: 9 (ref 5–15)
BUN: 11 mg/dL (ref 6–20)
CO2: 27 mmol/L (ref 22–32)
Calcium: 8.7 mg/dL — ABNORMAL LOW (ref 8.9–10.3)
Chloride: 101 mmol/L (ref 98–111)
Creatinine, Ser: 0.78 mg/dL (ref 0.44–1.00)
GFR, Estimated: >60 mL/min (ref >60)
Glucose, Bld: 89 mg/dL (ref 70–99)
Potassium: 3.7 mmol/L (ref 3.5–5.1)
Sodium: 137 mmol/L (ref 135–145)

## 2022-09-03 NOTE — Progress Notes (Signed)
[facility] Progress Note  Patient: Kaylee White     GLO:[id number]  DOA: 08/31/2022   PCP: [place], [place]       Brief hospital course: 52 year old female with GERD and on and off tobacco abuse for years presents to the ED for cough dyspnea and generalized bodyaches.  In the ED she was found to have influenza A, noted to be wheezing, noted to be hypoxic with a pulse ox of 86%. She was given IV magnesium, 60 mg of prednisone, Tamiflu, DuoNebs x 2, Toradol and 1 L of lactated Ringer.  Subjective:  Breathing better today. Still has cough.   Assessment and Plan: Principal Problem:   Acute respiratory failure with hypoxia ([facility])   Influenza A - She is improving with the current treatments - I have asked her to ambulate multiple times today, use the incentive spirometer and the flutter valve and continue to receive DuoNeb treatments - She does not have any money to pay for medication until Monday when her employer will advance her check.       Code Status: Full Code Consultants: None Level of Care: Level of care: Med-Surg Total time on patient care: 35 minutes DVT prophylaxis:  enoxaparin (LOVENOX) injection 40 mg Start: 08/31/22 2200     Objective:   Vitals:   09/02/22 2052 09/02/22 2209 09/03/22 0559 09/03/22 0828  BP:  110/71 118/77 (!) 134/90  Pulse: 68 68 63 63  Resp: 16   16  Temp:  98.4 F (36.9 C) 98.8 F (37.1 C) 98.2 F (36.8 C)  TempSrc:  Oral Oral Oral  SpO2: 99% 100% 100% 100%  Weight:      Height:       Filed Weights   08/31/22 2119  Weight: 73 kg   Exam: General exam: Appears comfortable  HEENT: oral mucosa moist Respiratory system: mild rhonchi bilaterally today Cardiovascular system: S1 & S2 heard  Gastrointestinal system: Abdomen soft, non-tender, nondistended. Normal bowel sounds   Extremities: No cyanosis, clubbing or edema Psychiatry:  Mood & affect appropriate.   CBC: Recent Labs  Lab 08/31/22 1215  09/01/22 0105  WBC 12.6* 10.1  NEUTROABS 10.9*  --   HGB 13.3 11.6*  HCT 40.5 36.2  MCV 92.0 93.8  PLT 324 188    Basic Metabolic Panel: Recent Labs  Lab 08/31/22 1215 09/01/22 0105 09/03/22 0823  NA 136 137 137  K 3.8 3.3* 3.7  CL 101 102 101  CO2 24 20* 27  GLUCOSE 127* 241* 89  BUN 10 9 11   CREATININE 0.72 1.06* 0.78  CALCIUM 8.9 8.9 8.7*    GFR: Estimated Creatinine Clearance: 74.2 mL/min (by C-G formula based on SCr of 0.78 mg/dL).  Scheduled Meds:  benzonatate  200 mg Oral TID   chlorpheniramine-HYDROcodone  5 mL Oral Q12H   enoxaparin (LOVENOX) injection  40 mg Subcutaneous Q24H   guaiFENesin  600 mg Oral BID   ipratropium-albuterol  6 mL Nebulization BID   oseltamivir  75 mg Oral BID   pantoprazole  40 mg Oral BID AC   predniSONE  40 mg Oral Q breakfast   Continuous Infusions: Imaging and lab data was personally reviewed No results found.  LOS: 1 day   Author: Debbe Odea  09/03/2022 11:04 AM  To contact [facility]>   Check the care team in [facility] and look for the attending/consulting [facility] provider listed  Log into [email or web address] and use [place]'s universal password   Go to> "[facility]"  and find provider  If you still have difficulty reaching the provider, please page the [facility] (Director on Call) for the Hospitalists listed on amion

## 2022-09-03 NOTE — Plan of Care (Signed)
  Problem: Education: Goal: Knowledge of General Education information will improve Description: Including pain rating scale, medication(s)/side effects and non-pharmacologic comfort measures Outcome: Progressing   Problem: Nutrition: Goal: Adequate nutrition will be maintained Outcome: Progressing   Problem: Coping: Goal: Level of anxiety will decrease Outcome: Progressing   

## 2022-09-04 ENCOUNTER — Other Ambulatory Visit (HOSPITAL_COMMUNITY): Payer: Self-pay

## 2022-09-04 MED ORDER — ALBUTEROL SULFATE HFA 108 (90 BASE) MCG/ACT IN AERS
2.0000 | INHALATION_SPRAY | Freq: Four times a day (QID) | RESPIRATORY_TRACT | 0 refills | Status: AC | PRN
  Filled 2022-09-04: qty 18, 30d supply, fill #0

## 2022-09-04 MED ORDER — GUAIFENESIN ER 600 MG PO TB12
600.0000 mg | ORAL_TABLET | Freq: Two times a day (BID) | ORAL | 0 refills | Status: AC
  Filled 2022-09-04: qty 20, 10d supply, fill #0

## 2022-09-04 MED ORDER — HYDROCOD POLI-CHLORPHE POLI ER 10-8 MG/5ML PO SUER
5.0000 mL | Freq: Two times a day (BID) | ORAL | 0 refills | Status: AC | PRN
  Filled 2022-09-04: qty 115, 12d supply, fill #0

## 2022-09-04 MED ORDER — IPRATROPIUM-ALBUTEROL 0.5-2.5 (3) MG/3ML IN SOLN
6.0000 mL | Freq: Two times a day (BID) | RESPIRATORY_TRACT | 2 refills | Status: AC
  Filled 2022-09-04: qty 180, 15d supply, fill #0

## 2022-09-04 NOTE — Progress Notes (Signed)
Ria Comment, RN, reviewed d/c paperwork with patient and answered questions. Nebulizer machine brought by [employer]. Patient wheeled to [place] to pick up medications.

## 2022-09-04 NOTE — Plan of Care (Signed)
  Problem: Education: Goal: Knowledge of General Education information will improve Description: Including pain rating scale, medication(s)/side effects and non-pharmacologic comfort measures 09/04/2022 1044 by Santa Lighter, RN Outcome: Adequate for Discharge 09/04/2022 [phone] by Santa Lighter, RN Outcome: Progressing   Problem: Health Behavior/Discharge Planning: Goal: Ability to manage health-related needs will improve 09/04/2022 1044 by Santa Lighter, RN Outcome: Adequate for Discharge 09/04/2022 [phone] by Santa Lighter, RN Outcome: Progressing   Problem: Clinical Measurements: Goal: Ability to maintain clinical measurements within normal limits will improve 09/04/2022 1044 by Santa Lighter, RN Outcome: Adequate for Discharge 09/04/2022 [phone] by Santa Lighter, RN Outcome: Progressing Goal: Will remain free from infection Outcome: Adequate for Discharge Goal: Diagnostic test results will improve Outcome: Adequate for Discharge Goal: Respiratory complications will improve Outcome: Adequate for Discharge Goal: Cardiovascular complication will be avoided Outcome: Adequate for Discharge   Problem: Activity: Goal: Risk for activity intolerance will decrease Outcome: Adequate for Discharge   Problem: Nutrition: Goal: Adequate nutrition will be maintained Outcome: Adequate for Discharge   Problem: Coping: Goal: Level of anxiety will decrease Outcome: Adequate for Discharge   Problem: Elimination: Goal: Will not experience complications related to bowel motility Outcome: Adequate for Discharge Goal: Will not experience complications related to urinary retention Outcome: Adequate for Discharge   Problem: Pain Managment: Goal: General experience of comfort will improve Outcome: Adequate for Discharge   Problem: Safety: Goal: Ability to remain free from injury will improve Outcome: Adequate for Discharge   Problem: Skin Integrity: Goal: Risk for impaired skin integrity  will decrease Outcome: Adequate for Discharge

## 2022-09-04 NOTE — Plan of Care (Signed)

## 2022-09-04 NOTE — Discharge Summary (Addendum)
Physician Discharge Summary  NARE GASPARI HEN:[id number] DOB: 1970/11/11 DOA: 08/31/2022  PCP: [place]  Admit date: 08/31/2022 Discharge date: 09/04/2022  Admitted From: Home Discharge disposition: Home  Recommendations at discharge:  Continue as needed Mucinex, Tussionex, DuoNeb and albuterol Stop smoking  History of Present Illness / Brief narrative:  Patient is a 52 year old female with GERD and on and off tobacco abuse for years. 1/18, patient presented to the ED with complaint of cough, dyspnea, generalized body ache.   In the ED she was found to be wheezing, with O2 sat low at 86%.   Respiratory virus panel positive for influenza A. She was started on Tamiflu, prednisone Admitted to [facility] Over the course of next few days, patient gradually improved Feels ready for discharge today.  Subjective:  Patient was seen and examined this morning.  Middle-aged African-American female propped up in bed.  Not in distress.  Not on supplemental oxygen.  Still has cough especially on exertion and deep breathing.  States she has difficulty bringing the phlegm up.  Hospital Course:  Acute respiratory failure with hypoxia Influenza A Completed 5-day course of Tamiflu.   Feels better and ready for discharge. Continue Mucinex DM, PRN albuterol.  Continue incentive spirometry at home. While hypoxic in the ED and required supplemental oxygen.  Gradually weaned down.  Currently, she is able to ambulate on the hallway without supplemental oxygen  Tobacco abuse Counseled to stop smoking.  Goals of care   Code Status: Full Code    Diet:  Diet Order             Diet general           Diet regular Room service appropriate? Yes; Fluid consistency: Thin  Diet effective now                   Nutritional status:  Body mass index is 31.43 kg/m.        Wounds:  -    Discharge Exam:   Vitals:   09/03/22 2131 09/04/22 0533 09/04/22 0721 09/04/22  0747  BP: 137/80 135/74 130/82   Pulse: 66 (!) 59 60 61  Resp: 20   16  Temp: 98.3 F (36.8 C) 98 F (36.7 C) 97.9 F (36.6 C)   TempSrc: Oral Oral Oral   SpO2: 99% 98% 100% 100%  Weight:      Height:        Body mass index is 31.43 kg/m.  General exam: Pleasant, middle-aged, Skin: No rashes, lesions or ulcers. HEENT: Atraumatic, normocephalic, no obvious bleeding Lungs: Scattered bilateral rhonchi immediately after coughing.  No wheezing.  Not on supplemental oxygen CVS: Regular rate and rhythm, no murmur GI/Abd soft, nontender, nondistended, bowel sound present CNS: Alert, awake, oriented x 3 Psychiatry: Mood appropriate Extremities: No pedal edema, no calf tenderness  Follow ups:    Follow-up Information     [facility] Follow up.   Contact information: [place] [id number] [phone]                 Discharge Instructions:   Discharge Instructions     Call MD for:  difficulty breathing, headache or visual disturbances   Complete by: As directed    Call MD for:  extreme fatigue   Complete by: As directed    Call MD for:  hives   Complete by: As directed    Call MD for:  persistant dizziness or  light-headedness   Complete by: As directed    Call MD for:  persistant nausea and vomiting   Complete by: As directed    Call MD for:  severe uncontrolled pain   Complete by: As directed    Call MD for:  temperature >100.4   Complete by: As directed    Diet general   Complete by: As directed    Discharge instructions   Complete by: As directed    Recommendations at discharge:   Continue as needed Mucinex, Tussionex and albuterol  Stop smoking   Chestcharge instructions: Follow with Primary MD [facility] in 7 days  Please request your PCP  to go over your hospital tests, procedures, radiology results at the follow up. Please get your medicines reviewed and adjusted.  Your PCP may decide  to repeat certain labs or tests as needed. Do not drive, operate heavy machinery, perform activities at heights, swimming or participation in water activities or provide baby sitting services if your were admitted for syncope or siezures until you have seen by Primary MD or a Neurologist and advised to do so again. [place] Controlled Substance Reporting System database was reviewed. Do not drive, operate heavy machinery, perform activities at heights, swim, participate in water activities or provide baby-sitting services while on medications for pain, sleep and mood until your outpatient physician has reevaluated you and advised to do so again.  You are strongly recommended to comply with the dose, frequency and duration of prescribed medications. Activity: As tolerated with Full fall precautions use walker/cane & assistance as needed Avoid using any recreational substances like cigarette, tobacco, alcohol, or non-prescribed drug. If you experience worsening of your admission symptoms, develop shortness of breath, life threatening emergency, suicidal or homicidal thoughts you must seek medical attention immediately by calling 911 or calling your MD immediately  if symptoms less severe. You must read complete instructions/literature along with all the possible adverse reactions/side effects for all the medicines you take and that have been prescribed to you. Take any new medicine only after you have completely understood and accepted all the possible adverse reactions/side effects.  Wear Seat belts while driving. You were cared for by a hospitalist during your hospital stay. If you have any questions about your discharge medications or the care you received while you were in the hospital after you are discharged, you can call the unit and ask to speak with the hospitalist or the covering physician. Once you are discharged, your primary care physician will handle any further medical issues. Please note  that NO REFILLS for any discharge medications will be authorized once you are discharged, as it is imperative that you return to your primary care physician (or establish a relationship with a primary care physician if you do not have one).   Increase activity slowly   Complete by: As directed        Discharge Medications:   Allergies as of 09/04/2022       Reactions   Aspirin Itching, Nausea And Vomiting   Tylenol [acetaminophen] Itching, Nausea And Vomiting        Medication List     STOP taking these medications    omeprazole 40 MG capsule Commonly known as: PRILOSEC       TAKE these medications    albuterol 108 (90 Base) MCG/ACT inhaler Commonly known as: VENTOLIN HFA Inhale 2 puffs into the lungs every 6 (six) hours as needed for wheezing or shortness of breath.  chlorpheniramine-HYDROcodone 10-8 MG/5ML Commonly known as: TUSSIONEX Take 5 mLs by mouth every 12 (twelve) hours as needed for up to 12 days for cough.   guaiFENesin 600 MG 12 hr tablet Commonly known as: MUCINEX Take 1 tablet (600 mg total) by mouth 2 (two) times daily for 10 days.   ipratropium-albuterol 0.5-2.5 (3) MG/3ML Soln Commonly known as: DUONEB Take 6 mLs by nebulization 2 (two) times daily.               Durable Medical Equipment  (From admission, onward)           Start     Ordered   09/04/22 1039  For home use only DME Nebulizer machine  Once       Question Answer Comment  Patient needs a nebulizer to treat with the following condition COPD (chronic obstructive pulmonary disease) ([place])   Length of Need Lifetime      09/04/22 1038             The results of significant diagnostics from this hospitalization (including imaging, microbiology, ancillary and laboratory) are listed below for reference.    Procedures and Diagnostic Studies:   DG Chest 2 View  Result Date: 08/31/2022 CLINICAL DATA:  Fever, chills and cough EXAM: CHEST - 2 VIEW COMPARISON:  Chest  radiograph 04/15/2019 FINDINGS: The cardiomediastinal contours are within normal limits. The lungs are clear. No pneumothorax or pleural effusion. No acute finding in the visualized skeleton. IMPRESSION: No active cardiopulmonary disease. Electronically Signed   By: Audie Pinto M.D.   On: 08/31/2022 13:06     Labs:   Basic Metabolic Panel: Recent Labs  Lab 08/31/22 1215 09/01/22 0105 09/03/22 0823  NA 136 137 137  K 3.8 3.3* 3.7  CL 101 102 101  CO2 24 20* 27  GLUCOSE 127* 241* 89  BUN 10 9 11   CREATININE 0.72 1.06* 0.78  CALCIUM 8.9 8.9 8.7*   GFR Estimated Creatinine Clearance: 74.2 mL/min (by C-G formula based on SCr of 0.78 mg/dL). Liver Function Tests: No results for input(s): "AST", "ALT", "ALKPHOS", "BILITOT", "PROT", "ALBUMIN" in the last 168 hours. No results for input(s): "LIPASE", "AMYLASE" in the last 168 hours. No results for input(s): "AMMONIA" in the last 168 hours. Coagulation profile No results for input(s): "INR", "PROTIME" in the last 168 hours.  CBC: Recent Labs  Lab 08/31/22 1215 09/01/22 0105  WBC 12.6* 10.1  NEUTROABS 10.9*  --   HGB 13.3 11.6*  HCT 40.5 36.2  MCV 92.0 93.8  PLT 324 282   Cardiac Enzymes: No results for input(s): "CKTOTAL", "CKMB", "CKMBINDEX", "TROPONINI" in the last 168 hours. BNP: Invalid input(s): "POCBNP" CBG: Recent Labs  Lab 09/01/22 0811 09/02/22 0250  GLUCAP 140* 99   D-Dimer No results for input(s): "DDIMER" in the last 72 hours. Hgb A1c No results for input(s): "HGBA1C" in the last 72 hours. Lipid Profile No results for input(s): "CHOL", "HDL", "LDLCALC", "TRIG", "CHOLHDL", "LDLDIRECT" in the last 72 hours. Thyroid function studies No results for input(s): "TSH", "T4TOTAL", "T3FREE", "THYROIDAB" in the last 72 hours.  Invalid input(s): "FREET3" Anemia work up No results for input(s): "VITAMINB12", "FOLATE", "FERRITIN", "TIBC", "IRON", "RETICCTPCT" in the last 72 hours. Microbiology Recent  Results (from the past 240 hour(s))  Resp panel by RT-PCR (RSV, Flu A&B, Covid) Nasopharyngeal Swab     Status: Abnormal   Collection Time: 08/31/22 11:59 AM   Specimen: Nasopharyngeal Swab; Nasal Swab  Result Value Ref Range Status   SARS Coronavirus  2 by RT PCR NEGATIVE NEGATIVE Final    Comment: (NOTE) SARS-CoV-2 target nucleic acids are NOT DETECTED.  The SARS-CoV-2 RNA is generally detectable in upper respiratory specimens during the acute phase of infection. The lowest concentration of SARS-CoV-2 viral copies this assay can detect is 138 copies/mL. A negative result does not preclude SARS-Cov-2 infection and should not be used as the sole basis for treatment or other patient management decisions. A negative result may occur with  improper specimen collection/handling, submission of specimen other than nasopharyngeal swab, presence of viral mutation(s) within the areas targeted by this assay, and inadequate number of viral copies(<138 copies/mL). A negative result must be combined with clinical observations, patient history, and epidemiological information. The expected result is Negative.  Fact Sheet for Patients:  BloggerCourse.com  Fact Sheet for Healthcare Providers:  SeriousBroker.it  This test is no t yet approved or cleared by the [place] FDA and  has been authorized for detection and/or diagnosis of SARS-CoV-2 by FDA under an Emergency Use Authorization (EUA). This EUA will remain  in effect (meaning this test can be used) for the duration of the COVID-19 declaration under Section 564(b)(1) of the Act, 21 U.S.C.section 360bbb-3(b)(1), unless the authorization is terminated  or revoked sooner.       Influenza A by PCR POSITIVE (A) NEGATIVE Final   Influenza B by PCR NEGATIVE NEGATIVE Final    Comment: (NOTE) The Xpert Xpress SARS-CoV-2/FLU/RSV plus assay is intended as an aid in the diagnosis of influenza  from Nasopharyngeal swab specimens and should not be used as a sole basis for treatment. Nasal washings and aspirates are unacceptable for Xpert Xpress SARS-CoV-2/FLU/RSV testing.  Fact Sheet for Patients: BloggerCourse.com  Fact Sheet for Healthcare Providers: SeriousBroker.it  This test is not yet approved or cleared by the [place] FDA and has been authorized for detection and/or diagnosis of SARS-CoV-2 by FDA under an Emergency Use Authorization (EUA). This EUA will remain in effect (meaning this test can be used) for the duration of the COVID-19 declaration under Section 564(b)(1) of the Act, 21 U.S.C. section 360bbb-3(b)(1), unless the authorization is terminated or revoked.     Resp Syncytial Virus by PCR NEGATIVE NEGATIVE Final    Comment: (NOTE) Fact Sheet for Patients: BloggerCourse.com  Fact Sheet for Healthcare Providers: SeriousBroker.it  This test is not yet approved or cleared by the [place] FDA and has been authorized for detection and/or diagnosis of SARS-CoV-2 by FDA under an Emergency Use Authorization (EUA). This EUA will remain in effect (meaning this test can be used) for the duration of the COVID-19 declaration under Section 564(b)(1) of the Act, 21 U.S.C. section 360bbb-3(b)(1), unless the authorization is terminated or revoked.  Performed at [facility], [address], [place], [place] [address]     Time coordinating discharge: 35 minutes  Signed: Nanea Jared  [facility] 09/04/2022, 10:39 AM

## 2022-09-05 ENCOUNTER — Telehealth: Payer: Self-pay

## 2022-09-05 NOTE — Telephone Encounter (Signed)
Transition Care Management Unsuccessful Follow-up Telephone Call  Date of discharge and from where:  [facility] 09/04/2022  Attempts:  1st Attempt  Reason for unsuccessful TCM follow-up call:  Left voice message [name], [place] Direct Dial [phone]

## 2022-09-06 NOTE — Telephone Encounter (Signed)
Transition Care Management Follow-up Telephone Call Date of discharge and from where: [facility] 09/04/2022 How have you been since you were released from the hospital? better Any questions or concerns? No  Items Reviewed: Did the pt receive and understand the discharge instructions provided? Yes  Medications obtained and verified? Yes  Other? No  Any new allergies since your discharge? No  Dietary orders reviewed? Yes Do you have support at home? Yes   Home Care and Equipment/Supplies: Were home health services ordered? no If so, what is the name of the agency? N/a  Has the agency set up a time to come to the patient's home? not applicable Were any new equipment or medical supplies ordered?  No What is the name of the medical supply agency? N/a Were you able to get the supplies/equipment? not applicable Do you have any questions related to the use of the equipment or supplies? No  Functional Questionnaire: (I = Independent and D = Dependent) ADLs: I  Bathing/Dressing- I  Meal Prep- I  Eating- I  Maintaining continence- I  Transferring/Ambulation- I  Managing Meds- I  Follow up appointments reviewed:  PCP Hospital f/u appt confirmed? No  has appt with [facility] f/u appt confirmed? No   Are transportation arrangements needed? No  If their condition worsens, is the pt aware to call PCP or go to the Emergency Dept.? Yes Was the patient provided with contact information for the PCP's office or ED? Yes Was to pt encouraged to call back with questions or concerns? Yes  Kaylee Crumble, LPN [place] Direct Dial [phone]

## 2023-04-20 ENCOUNTER — Emergency Department (HOSPITAL_COMMUNITY)
Admission: EM | Admit: 2023-04-20 | Discharge: 2023-04-20 | Disposition: A | Discharge status: Home / Self Care | Payer: Medicaid Other | Attending: Emergency Medicine | Admitting: Emergency Medicine

## 2023-04-20 ENCOUNTER — Other Ambulatory Visit: Payer: Self-pay

## 2023-04-20 ENCOUNTER — Encounter (HOSPITAL_COMMUNITY): Payer: Self-pay

## 2023-04-20 DIAGNOSIS — R5383 Other fatigue: Secondary | ICD-10-CM | POA: Insufficient documentation

## 2023-04-20 DIAGNOSIS — R5381 Other malaise: Secondary | ICD-10-CM

## 2023-04-20 DIAGNOSIS — R519 Headache, unspecified: Secondary | ICD-10-CM | POA: Insufficient documentation

## 2023-04-20 LAB — CBC WITH DIFFERENTIAL/PLATELET
Abs Immature Granulocytes: 0.02 10*3/uL (ref 0.00–0.07)
Basophils Absolute: 0.1 10*3/uL (ref 0.0–0.1)
Basophils Relative: 1 %
Eosinophils Absolute: 0.2 10*3/uL (ref 0.0–0.5)
Eosinophils Relative: 2 %
HCT: 40.6 % (ref 36.0–46.0)
Hemoglobin: 13.2 g/dL (ref 12.0–15.0)
Immature Granulocytes: 0 %
Lymphocytes Relative: 37 %
Lymphs Abs: 3.2 10*3/uL (ref 0.7–4.0)
MCH: 30.5 pg (ref 26.0–34.0)
MCHC: 32.5 g/dL (ref 30.0–36.0)
MCV: 93.8 fL (ref 80.0–100.0)
Monocytes Absolute: 0.8 10*3/uL (ref 0.1–1.0)
Monocytes Relative: 10 %
Neutro Abs: 4.5 10*3/uL (ref 1.7–7.7)
Neutrophils Relative %: 50 %
Platelets: 363 10*3/uL (ref 150–400)
RBC: 4.33 MIL/uL (ref 3.87–5.11)
RDW: 12.6 % (ref 11.5–15.5)
WBC: 8.9 10*3/uL (ref 4.0–10.5)
nRBC: 0 % (ref 0.0–0.2)

## 2023-04-20 LAB — COMPREHENSIVE METABOLIC PANEL
ALT: 26 U/L (ref 0–44)
AST: 24 U/L (ref 15–41)
Albumin: 3.8 g/dL (ref 3.5–5.0)
Alkaline Phosphatase: 94 U/L (ref 38–126)
Anion gap: 13 (ref 5–15)
BUN: 10 mg/dL (ref 6–20)
CO2: 22 mmol/L (ref 22–32)
Calcium: 9.3 mg/dL (ref 8.9–10.3)
Chloride: 108 mmol/L (ref 98–111)
Creatinine, Ser: 0.84 mg/dL (ref 0.44–1.00)
GFR, Estimated: >60 mL/min (ref >60)
Glucose, Bld: 89 mg/dL (ref 70–99)
Potassium: 3.9 mmol/L (ref 3.5–5.1)
Sodium: 143 mmol/L (ref 135–145)
Total Bilirubin: 0.5 mg/dL (ref 0.3–1.2)
Total Protein: 7.7 g/dL (ref 6.5–8.1)

## 2023-04-20 MED ORDER — ALBUTEROL SULFATE HFA 108 (90 BASE) MCG/ACT IN AERS
1.0000 | INHALATION_SPRAY | Freq: Once | RESPIRATORY_TRACT | Status: AC
  Administered 2023-04-20: 1 via RESPIRATORY_TRACT
  Filled 2023-04-20: qty 6.7

## 2023-04-20 NOTE — ED Provider Notes (Signed)
[facility] EMERGENCY DEPARTMENT AT [facility] Provider Note   CSN: [id number] Arrival date & time: 04/20/23  1058     History No chief complaint on file.   Kaylee White is a 52 y.o. female patient who presents to the emergency department today for further evaluation to get tested for carbon dioxide.  She has been seeing a mist in her house and has had a foul odor.  She states that this is not carbon monoxide but she is requesting carbon dioxide testing.  She has an app on her phone that recommended coming and getting evaluated for this.  She reports associated headache, general malaise, fatigue.  She denies any chest pain or shortness of breath.  No fever or chills.  HPI     Home Medications Prior to Admission medications   Medication Sig Start Date End Date Taking? Authorizing Provider  albuterol (VENTOLIN HFA) 108 (90 Base) MCG/ACT inhaler Inhale 2 puffs into the lungs every 6 (six) hours as needed for wheezing or shortness of breath. 09/04/22   Dahal, Melina Schools, MD  ipratropium-albuterol (DUONEB) 0.5-2.5 (3) MG/3ML SOLN Use 2 vials ( ) by nebulization 2 (two) times daily. 09/04/22 12/03/22  Lorin Glass, MD      Allergies    Aspirin and Tylenol [acetaminophen]    Review of Systems   Review of Systems  All other systems reviewed and are negative.   Physical Exam Updated Vital Signs BP 136/89   Pulse 82   Temp 98.1 F (36.7 C)   Resp 16   Ht 5' (1.524 m)   Wt 73.2 kg   LMP 05/30/2019 (LMP Unknown)   SpO2 99%   BMI 31.52 kg/m  Physical Exam Vitals and nursing note reviewed.  Constitutional:      General: She is not in acute distress.    Appearance: Normal appearance.  HENT:     Head: Normocephalic and atraumatic.  Eyes:     General:        Right eye: No discharge.        Left eye: No discharge.  Cardiovascular:     Comments: Regular rate and rhythm.  S1/S2 are distinct without any evidence of murmur, rubs, or gallops.  Radial pulses are 2+  bilaterally.  Dorsalis pedis pulses are 2+ bilaterally.  No evidence of pedal edema. Pulmonary:     Comments: Clear to auscultation bilaterally.  Normal effort.  No respiratory distress.  No evidence of wheezes, rales, or rhonchi heard throughout. Abdominal:     General: Abdomen is flat. Bowel sounds are normal. There is no distension.     Tenderness: There is no abdominal tenderness. There is no guarding or rebound.  Musculoskeletal:        General: Normal range of motion.     Cervical back: Neck supple.  Skin:    General: Skin is warm and dry.     Findings: No rash.  Neurological:     General: No focal deficit present.     Mental Status: She is alert.  Psychiatric:        Mood and Affect: Mood normal.        Behavior: Behavior normal.     ED Results / Procedures / Treatments   Labs (all labs ordered are listed, but only abnormal results are displayed) Labs Reviewed  CBC WITH DIFFERENTIAL/PLATELET  COMPREHENSIVE METABOLIC PANEL    EKG None  Radiology No results found.  Procedures Procedures    Medications Ordered in ED Medications  albuterol (VENTOLIN HFA) 108 (90 Base) MCG/ACT inhaler 1 puff (1 puff Inhalation Given 04/20/23 1305)    ED Course/ Medical Decision Making/ A&P Clinical Course as of 04/20/23 1305  Fri Apr 20, 2023  1302 CBC with Differential Normal.  [CF]  1302 Comprehensive metabolic panel Normal.  [CF]    Clinical Course User Index [CF] Teressa Lower, PA-C   {   Click here for ABCD2, HEART and other calculators  Medical Decision Making Kaylee White is a 52 y.o. female patient who presents to the emergency department today for further evaluation of carbon dioxide testing.  I explained that carbon dioxide is a natural occurring gas and explained the physiology of carbon dioxide from oxygen.  She was adamant that she wanted testing for carbon dioxide.  It is not exactly clear what she is referring to.  She does not state that the foul odor  smells like eggs.  I am less suspicious for [place] monoxide as this is a odorless gas.  Will plan to get basic labs and give the patient an inhaler per her request and plan to discharge home.  Vital signs are completely normal and she is in no acute distress.   Amount and/or Complexity of Data Reviewed Labs: ordered.    Final Clinical Impression(s) / ED Diagnoses Final diagnoses:  Malaise    Rx / DC Orders ED Discharge Orders     None         Teressa Lower, [place] 04/20/23 1305    Alvira Monday, MD 04/21/23 [phone]

## 2023-04-20 NOTE — ED Notes (Signed)
PA Meredeth Ide at bedside

## 2023-04-20 NOTE — ED Triage Notes (Addendum)
Pt states she had been smelling a foul odor in her house and and in her clothes. Pt states she wants to be checked for carbon dioxide. Pt states her eyes are brownish bilat. Pt states sluggish and fatiguex1wk I don't observe eyes being brown.

## 2023-04-20 NOTE — Discharge Instructions (Signed)
Please keep your windows and doors open if you see this mist.  Please follow-up with your primary care doctor for further evaluation.  You may return to the emergency department for any worsening symptoms.

## 2023-04-20 NOTE — Progress Notes (Signed)
Per patient request and [facility]. I prayed for pt. And provided emotional and spiritual support.  Chaplain available as needed.  Venida Jarvis, [place], [facility], Pager [phone]

## 2023-04-20 NOTE — ED Notes (Signed)
Patient verbalizes understanding of discharge instructions. Opportunity for questioning and answers were provided. Pt discharged from ED. 

## 2023-09-27 ENCOUNTER — Other Ambulatory Visit: Payer: Self-pay

## 2023-09-27 ENCOUNTER — Emergency Department (HOSPITAL_COMMUNITY): Payer: Medicaid Other

## 2023-09-27 ENCOUNTER — Encounter (HOSPITAL_COMMUNITY): Payer: Self-pay

## 2023-09-27 ENCOUNTER — Emergency Department (HOSPITAL_COMMUNITY)
Admission: EM | Admit: 2023-09-27 | Discharge: 2023-09-27 | Discharge status: Against Medical Advice | Payer: Medicaid Other | Attending: Emergency Medicine | Admitting: Emergency Medicine

## 2023-09-27 DIAGNOSIS — R059 Cough, unspecified: Secondary | ICD-10-CM | POA: Insufficient documentation

## 2023-09-27 DIAGNOSIS — J3489 Other specified disorders of nose and nasal sinuses: Secondary | ICD-10-CM | POA: Diagnosis not present

## 2023-09-27 DIAGNOSIS — Z5321 Procedure and treatment not carried out due to patient leaving prior to being seen by health care provider: Secondary | ICD-10-CM | POA: Diagnosis not present

## 2023-09-27 LAB — RESP PANEL BY RT-PCR (RSV, FLU A&B, COVID)  RVPGX2
Influenza A by PCR: NEGATIVE
Influenza B by PCR: NEGATIVE
Resp Syncytial Virus by PCR: NEGATIVE
SARS Coronavirus 2 by RT PCR: NEGATIVE

## 2023-09-27 NOTE — ED Notes (Signed)
Pt stated that we are not treating her as an emergency and she will go to another ED or come back in the morning. Pt seen leaving the ED.

## 2023-09-27 NOTE — ED Triage Notes (Signed)
Patient reports abscess to right nare that is causing facial swelling.  Patient also complains of congestion and wants to get checked out as well as get her weight.

## 2023-09-27 NOTE — ED Provider Triage Note (Signed)
Emergency Medicine Provider Triage Evaluation Note  Kaylee White , a 53 y.o. female  was evaluated in triage.  Pt complains of swelling of her right nostril.  Denies any drainage from the area or difficulty breathing.  Also reports congestion and cough for the past week and wants to get that checked out.  Denies any abdominal pain, nausea or vomiting.  Denies any fever or chills.  Also wants her weight to be checked out.  Review of Systems  Positive: As above Negative: As above  Physical Exam  BP (!) 152/137 (BP Location: Right Arm)   Pulse 79   Temp 98.2 F (36.8 C) (Oral)   Resp 18   LMP 05/30/2019 (LMP Unknown)   SpO2 100%  Gen:   Awake, no distress   Resp:  Normal effort, talking in full sentences without difficulty MSK:   Moves extremities without difficulty  Other:  Edema of the right nostril consistent with folliculitis/cellulitis  Medical Decision Making  Medically screening exam initiated at 2:16 PM.  Appropriate orders placed.  Kaylee White was informed that the remainder of the evaluation will be completed by another provider, this initial triage assessment does not replace that evaluation, and the importance of remaining in the ED until their evaluation is complete.     Arabella Merles, PA-C 09/27/23 1418

## 2024-05-08 LAB — HEMOGLOBIN A1C: Hemoglobin A1C: 5.2

## 2024-05-08 LAB — AMB RESULTS CONSOLE CBG: Glucose: 119

## 2024-05-08 NOTE — Progress Notes (Signed)
 SDOH info given for transportation

## 2024-06-05 ENCOUNTER — Encounter: Payer: Self-pay | Admitting: Physician Assistant

## 2024-06-05 ENCOUNTER — Telehealth: Admitting: Physician Assistant

## 2024-06-05 NOTE — Progress Notes (Signed)
Patient left prior to assessment by provider.

## 2024-06-06 NOTE — Progress Notes (Signed)
 Pt attended 05/08/2024 screening event with BP of 129/87, blood sugar was 119, and A1C was 5.2. At event pt did not indicate any SDOH needs. Pt also noted that she is a smoker and listed Medicaid as her insurance at the event.  Per chart review pt is establishing care with a PCP Kaylee White; [facility] [employer]), insurance, and is a smoker. Pt does not indicate any SDOH needs at this time.  To ensure pt has established care with PCP additional pt f/u to be scheduled at this time per health equity protocol.

## 2024-07-07 ENCOUNTER — Encounter: Payer: Self-pay | Admitting: Internal Medicine

## 2024-07-07 ENCOUNTER — Ambulatory Visit (INDEPENDENT_AMBULATORY_CARE_PROVIDER_SITE_OTHER): Admitting: Internal Medicine

## 2024-07-07 VITALS — BP 124/80 | HR 73 | Resp 18 | Ht 60.0 in | Wt 143.0 lb

## 2024-07-07 DIAGNOSIS — E782 Mixed hyperlipidemia: Secondary | ICD-10-CM

## 2024-07-07 DIAGNOSIS — Z111 Encounter for screening for respiratory tuberculosis: Secondary | ICD-10-CM

## 2024-07-07 DIAGNOSIS — Z1231 Encounter for screening mammogram for malignant neoplasm of breast: Secondary | ICD-10-CM

## 2024-07-07 DIAGNOSIS — K219 Gastro-esophageal reflux disease without esophagitis: Secondary | ICD-10-CM

## 2024-07-07 DIAGNOSIS — Z23 Encounter for immunization: Secondary | ICD-10-CM

## 2024-07-07 DIAGNOSIS — Z113 Encounter for screening for infections with a predominantly sexual mode of transmission: Secondary | ICD-10-CM

## 2024-07-07 DIAGNOSIS — R7989 Other specified abnormal findings of blood chemistry: Secondary | ICD-10-CM

## 2024-07-07 MED ORDER — OMEPRAZOLE 20 MG PO CPDR: 20.0000 mg | DELAYED_RELEASE_CAPSULE | Freq: Every day | ORAL | 11 refills | Status: AC

## 2024-07-07 NOTE — Progress Notes (Signed)
 "   Subjective:    Patient ID: Kaylee White, female   DOB: 1971-05-13, 53 y.o.   MRN: [id number]   HPI  Here to establish   Needs TB testing for job application for [employer].  Does not have transportation to go to outpatient [facility] draw [facility].    2.  Burning aching feeling in chest after eating certain things or drinking things.  Wine, beer sets off.  Grapefruit juice.   Energy drinks, but has not noted specifically with coffee, teas. Flavored sodas, but not necessarily caffeinated.   Has not noted with onions, tomatoes.   Does smoke. Lying down sets it off.   Taking something OTC, but pepcid or Tums, neither of which work.   No melena or hematochezia.    3.  Would like blood work today evaluate STI status.    No outpatient medications have been marked as taking for the 07/07/24 encounter (Office Visit) with Adella Norris, MD.   Allergies  Allergen Reactions   Aspirin Itching and Nausea And Vomiting   Tylenol  [Acetaminophen ] Itching and Nausea And Vomiting   Past Medical History:  Diagnosis Date   Alcohol abuse    GERD (gastroesophageal reflux disease)    History of COVID-19 08/27/2020   MDD (major depressive disorder)    multiple family members murdered or died in past.  Loss of son from murder was trigger for depression several years ago   Prediabetes 2021   Substance abuse (HCC)    Powder cocaine:  last time used was 06/2024.   Vitamin D  deficiency    Past Surgical History:  Procedure Laterality Date   CHOLECYSTECTOMY     MANDIBLE RECONSTRUCTION     Female partner broke her jaw   MULTIPLE TOOTH EXTRACTIONS     Family History  Problem Relation Age of Onset   COPD Mother        cause of death, though had other issues.  She had a tracheostomy   Asthma Father    Eczema Father    Asthma Sister    Epilepsy Maternal Aunt    Social History   Socioeconomic History   Marital status: Divorced    Spouse name: Not on file   Number of children:  Not on file   Years of education: Not on file   Highest education level: Not on file  Occupational History   Occupation: unemployed  Tobacco Use   Smoking status: Some Days    Current packs/day: 0.25    Average packs/day: 0.3 packs/day for 37.1 years (9.3 ttl pk-yrs)    Types: Cigarettes    Start date: 1989   Smokeless tobacco: Never  Vaping Use   Vaping status: Never Used  Substance and Sexual Activity   Alcohol use: Yes    Comment: 3 twelve oz beers daily.  Does not feel alcohol has been a problem for her, though listed as so in her history.   Drug use: Not Currently    Types: Cocaine, Marijuana    Comment: Last time used powder cocaine was yesterday.  Using at least weekly.  Was in rehab 4 years ago in [place] .  Two places in [facility].  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  Not going to a group.   Not using MJ since 2024.   Sexual activity: Yes    Birth control/protection: Condom    Comment: With a female partner she trusts yesterday 06/2024,  but not active generally.  Other Topics Concern   Not on file  Social History Narrative   Lives with her youngest son.     Friend living with them temporarily.   Social Drivers of Health   Tobacco Use: High Risk (07/07/2024)   Patient History    Smoking Tobacco Use: Some Days    Smokeless Tobacco Use: Never    Passive Exposure: Not on file  Financial Resource Strain: Medium Risk (07/07/2024)   Overall Financial Resource Strain (CARDIA)    Difficulty of Paying Living Expenses: Somewhat hard  Food Insecurity: Food Insecurity Present (07/07/2024)   Epic    Worried About Programme Researcher, Broadcasting/film/video in the Last Year: Sometimes true    Ran Out of Food in the Last Year: Sometimes true  Transportation Needs: Not on file  Physical Activity: Not on file  Stress: Not on file  Social Connections: Not on file  Intimate Partner Violence: Not At Risk (07/07/2024)   Epic    Fear of Current or Ex-Partner: No    Emotionally Abused: No    Physically Abused: No     Sexually Abused: No  Depression (PHQ2-9): Not on file  Alcohol Screen: Not on file  Housing: Unknown (07/07/2024)   Epic    Unable to Pay for Housing in the Last Year: No    Number of Times Moved in the Last Year: Not on file    Homeless in the Last Year: No  Utilities: Not At Risk (07/07/2024)   Epic    Threatened with loss of utilities: No  Health Literacy: Not on file     Review of Systems    Objective:   BP 124/80 (BP Location: Left Arm, Patient Position: Sitting, Cuff Size: Normal)   Pulse 73   Resp 18   Ht 5' (1.524 m)   Wt 143 lb (64.9 kg)   LMP 05/30/2019 (LMP Unknown)   BMI 27.93 kg/m   Physical Exam HENT:     Head: Normocephalic and atraumatic.     Right Ear: Tympanic membrane normal.     Left Ear: Tympanic membrane normal.     Nose: Nose normal.     Mouth/Throat:     Mouth: Mucous membranes are moist.     Pharynx: Oropharynx is clear.  Eyes:     Extraocular Movements: Extraocular movements intact.     Conjunctiva/sclera: Conjunctivae normal.     Pupils: Pupils are equal, round, and reactive to light.  Neck:     Thyroid: No thyroid mass or thyromegaly.  Cardiovascular:     Rate and Rhythm: Normal rate and regular rhythm.     Heart sounds: S1 normal and S2 normal. No murmur heard.    No friction rub. No S3 or S4 sounds.     Comments: No carotid bruits.  Carotid, radial, femoral, DP and PT pulses normal and equal.   Pulmonary:     Effort: Pulmonary effort is normal.     Breath sounds: Normal breath sounds and air entry.  Abdominal:     General: Bowel sounds are normal.     Palpations: Abdomen is soft. There is no hepatomegaly, splenomegaly or mass.     Tenderness: There is no abdominal tenderness.     Hernia: No hernia is present.  Musculoskeletal:     Cervical back: Normal range of motion and neck supple.     Right lower leg: No edema.     Left lower leg: No edema.  Lymphadenopathy:     Head:     Right side of head: No submental or  submandibular adenopathy.     Left side of head: No submental or submandibular adenopathy.     Cervical: No cervical adenopathy.     Upper Body:     Right upper body: No supraclavicular or axillary adenopathy.     Left upper body: No supraclavicular or axillary adenopathy.  Neurological:     Mental Status: She is alert.      Assessment & Plan   Substance Use Disorder:  She wants to work on setting up structure to stay away from use on her own currently.  If she does not feel this works, she will contact us  for intervention with SBT, LCSWA.    2.  Prediabetes:  but A1C fine 04/2024.    3.  Hyperlipidemia:  Quite high many years ago.  Repeat today  4.  HM:  mammogram, Shingrix  #1/2  5.  GERD:  Relux precautions given.  Omeprazole  20 mg daily  6.  Hx of Vitamin D  deficiency:  check level.  7.  STI work up:  HIV, Hep C, RPR, GC/chlamydia, urine.    8.  Need for TB screening:  Quantiferon "

## 2024-07-08 LAB — COMPREHENSIVE METABOLIC PANEL WITH GFR
ALT: 21 IU/L (ref 0–32)
AST: 20 IU/L (ref 0–40)
Albumin: 4.4 g/dL (ref 3.8–4.9)
Alkaline Phosphatase: 117 IU/L (ref 49–135)
BUN/Creatinine Ratio: 22 (ref 9–23)
BUN: 18 mg/dL (ref 6–24)
Bilirubin Total: <0.2 mg/dL (ref 0.0–1.2)
CO2: 20 mmol/L (ref 20–29)
Calcium: 9.7 mg/dL (ref 8.7–10.2)
Chloride: 103 mmol/L (ref 96–106)
Creatinine, Ser: 0.82 mg/dL (ref 0.57–1.00)
Globulin, Total: 3.7 g/dL (ref 1.5–4.5)
Glucose: 88 mg/dL (ref 70–99)
Potassium: 4.7 mmol/L (ref 3.5–5.2)
Sodium: 139 mmol/L (ref 134–144)
Total Protein: 8.1 g/dL (ref 6.0–8.5)
eGFR: 85 mL/min/1.73 (ref >59)

## 2024-07-08 LAB — LIPID PANEL W/O CHOL/HDL RATIO
Cholesterol, Total: 238 mg/dL — ABNORMAL HIGH (ref 100–199)
HDL: 63 mg/dL (ref >39)
LDL Chol Calc (NIH): 155 mg/dL — ABNORMAL HIGH (ref 0–99)
Triglycerides: 111 mg/dL (ref 0–149)
VLDL Cholesterol Cal: 20 mg/dL (ref 5–40)

## 2024-07-08 LAB — SYPHILIS: RPR WITH REFLEX TO RPR TITER: RPR Ser Ql: NONREACTIVE

## 2024-07-08 LAB — VITAMIN D 25 HYDROXY (VIT D DEFICIENCY, FRACTURES): Vit D, 25-Hydroxy: 10.6 ng/mL — ABNORMAL LOW (ref 30.0–100.0)

## 2024-07-10 LAB — GC/CHLAMYDIA PROBE AMP
Chlamydia trachomatis, NAA: NEGATIVE
Neisseria Gonorrhoeae by PCR: NEGATIVE

## 2024-07-18 ENCOUNTER — Other Ambulatory Visit (INDEPENDENT_AMBULATORY_CARE_PROVIDER_SITE_OTHER)

## 2024-07-18 DIAGNOSIS — Z111 Encounter for screening for respiratory tuberculosis: Secondary | ICD-10-CM | POA: Diagnosis not present

## 2024-07-18 DIAGNOSIS — Z23 Encounter for immunization: Secondary | ICD-10-CM

## 2024-07-21 ENCOUNTER — Ambulatory Visit: Payer: Self-pay | Admitting: Internal Medicine

## 2024-07-21 LAB — QUANTIFERON-TB GOLD PLUS
QuantiFERON Mitogen Value: >10 [IU]/mL
QuantiFERON Nil Value: 0.02 [IU]/mL
QuantiFERON TB1 Ag Value: 0.04 [IU]/mL
QuantiFERON TB2 Ag Value: 0.04 [IU]/mL
QuantiFERON-TB Gold Plus: NEGATIVE

## 2024-08-01 NOTE — Progress Notes (Unsigned)
 Pt attended 05/08/2024 screening event with BP of 129/87,non-fasting blood sugar was 119, and A1C was 5.2. At event pt did not indicate any SDOH needs. Pt also noted that she is a smoker and listed Medicaid as her insurance at the event.  Per chart review pt is scheduled to establishing care with a PCP Meg Bolds; [facility]) on 07/07/2024 insurance, and is a smoker. Chart review does not indicate any SDOH needs at this time.  60-day f/u Chart review indicates pt established care with Dr. Almarie, MD on 07/07/2024 at [facility]. On 07/07/2024 pt BP was 124/80. According to chart review pt indicated having food SDOH needs at her appt on 07/07/2024. Chart review also indicates a future appt with pcp on 01/01/2025. Attempted and unable to contact pt via phone. A vm was not able to be left on the home phone number. Letter sent with [employer], [place] [employer] and Smoking cessation resources information in case needed by the pt. Additional pt f/u to be scheduled per health equity protocol.

## 2024-08-19 ENCOUNTER — Ambulatory Visit

## 2024-09-10 ENCOUNTER — Ambulatory Visit: Payer: Self-pay | Admitting: Internal Medicine

## 2024-09-10 DIAGNOSIS — R7989 Other specified abnormal findings of blood chemistry: Secondary | ICD-10-CM | POA: Insufficient documentation

## 2024-09-10 DIAGNOSIS — K219 Gastro-esophageal reflux disease without esophagitis: Secondary | ICD-10-CM | POA: Insufficient documentation

## 2024-09-10 DIAGNOSIS — E782 Mixed hyperlipidemia: Secondary | ICD-10-CM | POA: Insufficient documentation

## 2025-01-01 ENCOUNTER — Encounter: Admitting: Internal Medicine
